# Patient Record
Sex: Female | Born: 1970 | Race: White | Hispanic: No | Smoking: Former smoker
Health system: Southern US, Community
[De-identification: ages and names within clinical notes are randomized; demographics above are authoritative.]

## PROBLEM LIST (undated history)

## (undated) DIAGNOSIS — T8859XA Other complications of anesthesia, initial encounter: Secondary | ICD-10-CM

## (undated) DIAGNOSIS — IMO0002 Reserved for concepts with insufficient information to code with codable children: Secondary | ICD-10-CM

## (undated) DIAGNOSIS — D649 Anemia, unspecified: Secondary | ICD-10-CM

## (undated) DIAGNOSIS — T4145XA Adverse effect of unspecified anesthetic, initial encounter: Secondary | ICD-10-CM

## (undated) HISTORY — PX: OTHER SURGICAL HISTORY: SHX169

---

## 2004-10-30 ENCOUNTER — Inpatient Hospital Stay (HOSPITAL_COMMUNITY): Admission: EM | Admit: 2004-10-30 | Discharge: 2004-11-07 | Payer: Self-pay | Admitting: Emergency Medicine

## 2004-11-15 ENCOUNTER — Encounter: Admission: RE | Admit: 2004-11-15 | Discharge: 2004-11-15 | Payer: Self-pay | Admitting: Cardiothoracic Surgery

## 2010-01-23 ENCOUNTER — Emergency Department (HOSPITAL_COMMUNITY): Admission: EM | Admit: 2010-01-23 | Discharge: 2010-01-24 | Payer: Self-pay | Admitting: Emergency Medicine

## 2010-01-29 ENCOUNTER — Emergency Department (HOSPITAL_COMMUNITY): Admission: EM | Admit: 2010-01-29 | Discharge: 2010-01-29 | Payer: Self-pay | Admitting: Emergency Medicine

## 2010-10-14 ENCOUNTER — Encounter: Payer: Self-pay | Admitting: Cardiothoracic Surgery

## 2010-12-11 LAB — CBC
Hemoglobin: 8.4 g/dL — ABNORMAL LOW (ref 12.0–15.0)
RBC: 4.52 MIL/uL (ref 3.87–5.11)
RDW: 17.9 % — ABNORMAL HIGH (ref 11.5–15.5)

## 2010-12-11 LAB — URINALYSIS, ROUTINE W REFLEX MICROSCOPIC
Bilirubin Urine: NEGATIVE
Ketones, ur: NEGATIVE mg/dL
Protein, ur: 30 mg/dL — AB
Urobilinogen, UA: 0.2 mg/dL (ref 0.0–1.0)

## 2010-12-11 LAB — COMPREHENSIVE METABOLIC PANEL
ALT: 17 U/L (ref 0–35)
Albumin: 4.3 g/dL (ref 3.5–5.2)
Alkaline Phosphatase: 58 U/L (ref 39–117)
Glucose, Bld: 101 mg/dL — ABNORMAL HIGH (ref 70–99)
Potassium: 3.2 mEq/L — ABNORMAL LOW (ref 3.5–5.1)
Sodium: 137 mEq/L (ref 135–145)
Total Protein: 7.4 g/dL (ref 6.0–8.3)

## 2010-12-11 LAB — WET PREP, GENITAL: Yeast Wet Prep HPF POC: NONE SEEN

## 2010-12-11 LAB — URINE MICROSCOPIC-ADD ON

## 2010-12-11 LAB — GC/CHLAMYDIA PROBE AMP, GENITAL
Chlamydia, DNA Probe: NEGATIVE
GC Probe Amp, Genital: NEGATIVE

## 2010-12-11 LAB — POCT PREGNANCY, URINE: Preg Test, Ur: NEGATIVE

## 2010-12-11 LAB — DIFFERENTIAL
Basophils Absolute: 0 10*3/uL (ref 0.0–0.1)
Basophils Relative: 0 % (ref 0–1)
Eosinophils Absolute: 0.1 10*3/uL (ref 0.0–0.7)
Monocytes Absolute: 0.6 10*3/uL (ref 0.1–1.0)
Neutrophils Relative %: 67 % (ref 43–77)

## 2011-02-08 NOTE — H&P (Signed)
Erin Ball, PERKOVICH NO.:  [id number]   MEDICAL RECORD NO.:  [id number]          PATIENT TYPE:  EMS   LOCATION:  MAJO                         FACILITY:  [facility]   PHYSICIAN:  Deirdre Peer. Polite, M.D. DATE OF BIRTH:  28-Mar-1971   DATE OF ADMISSION:  10/30/2004  DATE OF DISCHARGE:                                HISTORY & PHYSICAL   CHIEF COMPLAINT:  Cough, shortness of breath.   HISTORY OF PRESENT ILLNESS:  A 40 year old female with no significant past  medical history presents to the emergency department after being evaluated  at an urgent care facility at [facility]. The patient was seen there and  diagnosed with pneumonia. Sent to the emergency department for further  evaluation. At the time of my evaluation, the patient was in mild to  moderate distress secondary to excessive cough and shortness of breath. The  patient states that she has been feeling ill since December on and off.  States that she took ill again latter part of January to early February,  when she had paroxysms of cough, not really producing much sputum. Hot and  cold sweats, decreased appetite and vomiting over the last couple of days.  The patient states that she has been treating herself at home with Mucinex  and green tea. In the emergency department, as stated, the patient was  evaluated and found to be febrile at 102.2. Chest x-ray from outpatient  facility showed right lower lobe opacification. Admission is deemed  necessary for further evaluation and treatment of pneumonia and possible  empyema.   PAST MEDICAL HISTORY:  Negative.   MEDICATIONS:  Occasional Mucinex and green tea.   SOCIAL HISTORY:  Negative for tobacco, alcohol, or drugs.   PAST SURGICAL HISTORY:  The patient had tubes in tympanic membrane as a  child. Otherwise, no surgeries.   ALLERGIES:  No known drug allergies.   FAMILY HISTORY:  Mother with hypertension. Father with high cholesterol.   REVIEW OF SYSTEMS:   As stated in the HPI. Otherwise negative.   PHYSICAL EXAMINATION:  VITAL SIGNS:  Temperature 102.2, blood pressure  112/66, pulse 89, respiratory rate 24. Saturation 88% on room air, 95% on  nasal cannula.  GENERAL:  The patient is ill in appearance.  HEENT:  Significant for dry cracked lips, dry oral mucosa. No nodes. No  jugular venous distention appreciated.  CHEST:  Decreased breath sounds right hemi-thorax half way from the mid lung  field to the base with dullness to percussion. Left lung field, few basilar  crackles.  CARDIOVASCULAR:  Tachy.  ABDOMEN:  Soft, nontender.  EXTREMITIES:  No edema.  NEUROLOGIC:  Essentially non-focal.   LABORATORY DATA:  Chest x-ray:  Opacification right lung field from mid lung  to the base.   CBC:  White count 17.9, hemoglobin 10, hematocrit 30, MCV 71.3, platelets  556,000. ABG:  pH of 7.4, pCO2 39, pO2 of 98 on 2 liters nasal cannula.  Sodium 134, potassium 3.4, chloride 101, BUN 14, creatinine 0.7.   ASSESSMENT:  1.  Right lower lobe pneumonia plus/minus effusion. Must rule out empyema.  2.  Fever to 102.2.  3.  Nausea and vomiting.  4.  Leukocytosis.  5.  Anemia with low MCV.   RECOMMENDATIONS:  The patient to be admitted to step-down unit. The patient  is to be started on intravenous antibiotics, oxygen and nebulizers. The  patient will be pan cultured. Will obtain a STAT CT to rule out empyema. A  surgical consultation will be obtained if empyema is suggested by CT. Will  make further recommendations after review of the above.      RDP/MEDQ  D:  10/30/2004  T:  10/30/2004  Job:  [id number]

## 2011-02-08 NOTE — Op Note (Signed)
NAME:  Erin Ball, Erin Ball                 ACCOUNT NO.:  0   MEDICAL RECORD NO.:  [id number]           PATIENT TYPE:   LOCATION:                               FACILITY:  [facility]   PHYSICIAN:  Sheliah Plane, MD    DATE OF BIRTH:  05/13/1971   DATE OF PROCEDURE:  10/01/2004  DATE OF DISCHARGE:                                 OPERATIVE REPORT   PREOPERATIVE DIAGNOSIS:  Right effusion, rule out empyema.   POSTOPERATIVE DIAGNOSIS:  Right effusion, rule out empyema.   PROCEDURE PERFORMED:  1.  Right video assisted thoracoscopy.  2.  Right mini thoracotomy.  3.  Drainage of empyema.  4.  Decortication.   SURGEON:  Sheliah Plane, M.D.   FIRST ASSISTANT:  Maxwell Marion, N.P.   BRIEF HISTORY:  The patient is a 40 year old female who presented with  increasing right chest discomfort and episodic fever.  A chest x-ray and CT  scan reveal evidence of probable right empyema.  Drainage and decortication  was recommended to the patient who agreed and signed informed consent.   DESCRIPTION OF PROCEDURE:  The patient underwent general endotracheal  anesthesia without incidence.  The skin over the right chest was prepped  with Betadine and draped in the usual sterile manner.  The left lung only  was ventilated through a double lumen endotracheal tube.  Using a small  incision, a VATS trocar was introduced into the chest.  There was dense  adhesions and thickening of the pleura.  A small thoracotomy incision was  made and through this drainage of a large amount of purulent material was  carried out.  Areas of thickened parietal and visceral pleura were stripped  away.  The videoscope was used to assist in performing this and to ensure  all areas of purulent material were removed.  Two chest tubes were left in  place.  The incision was closed with a single pericostal stitch, interrupted  0 Vicryl in the muscle layers and 3-0 subcuticular stitch in the skin edges.  Dry dressings were applied.   The patient tolerated the procedure without  obvious complication and was extubated in the operating room and transferred  to the recovery room for further postoperative care.  Blood loss  approximately 150 mL.       EG/MEDQ  D:  02/27/2005  T:  02/27/2005  Job:  [id number]

## 2011-02-08 NOTE — Discharge Summary (Signed)
Erin Ball, WILLETTS NO.:  [id number]   MEDICAL RECORD NO.:  [id number]          PATIENT TYPE:  INP   LOCATION:  3308                         FACILITY:  [facility]   PHYSICIAN:  Sheliah Plane, MD    DATE OF BIRTH:  Nov 24, 1970   DATE OF ADMISSION:  10/30/2004  DATE OF DISCHARGE:                                 DISCHARGE SUMMARY   ADMISSION DIAGNOSIS:  Cough with shortness of breath.   PAST MEDICAL HISTORY AND DISCHARGE DIAGNOSIS:  Pneumonia with empyema  formation, status post right video assisted thoracoscopic surgery, mini  thoracotomy, drainage of empyema and decortication.   ALLERGIES:  No known drug allergies.   BRIEF HISTORY:  Patient is a 40 year old Caucasian female with no  significant past medical history who presented to [facility]  where she was diagnosed with pneumonia.  She was then sent to the emergency  department for further evaluation.  She was evaluated by Dr. Nehemiah Settle in the  ED and at the time of admission, she was complaining of an excessive cough  and shortness of breath.  She stated that she had begun to feel ill with  intermittent symptoms since the beginning of December.  The only symptom she  initially had was a nonproductive cough.  A few days prior to admission, she  began to experience hot and cold sweats with decreased appetite and  vomiting.  She had treated herself with Mucinex and green tea.  In the ED  when she was evaluated, the patient had a fever of 102.2 and a chest x-ray  revealed a right lower lobe opacification.  Patient was then admitted for  treatment of pneumonia and possible treatment of empyema.   HOSPITAL COURSE:  Patient was admitted via the emergency room on October 30, 2004, as previously stated.  Patient was started on IV antibiotics and a  stat chest CT was obtained to evaluate for empyema.  The CT revealed a large  complex loculated effusion of the right side consistent with empyema.  Secondary  to these results, Dr. Tyrone Sage of [facility] service was consulted  regarding treatment.  Dr. Tyrone Sage evaluated the patient on October 31, 2004, and it was his opinion that the patient should undergo a right video  assisted thoracoscopic surgery and drainage of empyema.   The patient was subsequently taken to the OR on October 31, 2004, for a  right video assisted thoracoscopic surgery, mini thoracotomy, drainage of  empyema and decortication.  The patient tolerated the procedure well and was  hemodynamically stable immediately postoperatively.  The patient was  transferred from the OR to the post anesthesia care unit in stable  condition.  The patient was extubated without complications and woke up from  anesthesia neurologically intact.   The remainder of the patient's postoperative course has progressed as  expected.  She has been maintained on IV Zosyn throughout the postoperative  course and her chest tubes were cared for in a routine manner.   On postoperative day #5, the patient was afebrile with stable vital signs  and the drainage of the chest tubes had  decreased significantly.  There had  been no air leaks present in the chest tubes since surgery.  The anterior  chest tube was discontinued on postoperative day #5 without complications.  The remaining chest tube was then discontinued on postoperative day #6 at  which time the patient was again afebrile with stable vital signs.  Her  chest x-ray was stable with no pneumothorax and showed an improving level of  the effusion.   PHYSICAL EXAMINATION:  CARDIAC:  Regular rate and rhythm.  LUNGS:  Crackles from the right mid lung down to the base and the chest tube  dressing was clean, dry and intact.   Patient is in stable condition at this time and pending morning round  reevaluation with chest x-ray, she should be stable for discharge in the  next one to two days.   LABORATORY DATA:  CBC on November 04, 2004, white count 9.1,  hemoglobin 8.9,  hematocrit 26.6, platelets 573.  BMP on November 04, 2004, sodium 134,  potassium 3.9, BUN less than 1, creatinine 0.6.  All cultures obtained in  the OR have returned negative.   CONDITION ON DISCHARGE:  Improved.   INSTRUCTIONS:  1.  Medication:      1.  Folic acid 1 mg p.o. daily.      2.  Iron supplement 325 mg daily.      3.  Tylox one to two p.o. q.4-6h. p.r.n. pain.      4.  Antibiotic choice will be made by Dr. Tyrone Sage on the day of          discharge.  As previously stated she has been maintained on IV Zosyn          throughout the hospital course.  2.  Activity:  The patient will be instructed to continue daily breathing      and walking exercises.  3.  Diet:  No restrictions.  4.  Wound care:  Patient should clean sites daily with soap and water.  If      the incisions become red, swollen or drain of if she experiences a fever      of 101 degrees F, the patient should contact the [facility] office.  5.  Follow-up appointment:      1.  [facility] November 15, 2004, at 11:20 a.m.      2.  Sheliah Plane, M.D., November 15, 2004, at 12:20 p.m.      AY/MEDQ  D:  11/06/2004  T:  11/06/2004  Job:  [id number]

## 2012-10-22 ENCOUNTER — Encounter (HOSPITAL_COMMUNITY): Payer: Self-pay | Admitting: *Deleted

## 2012-10-22 ENCOUNTER — Inpatient Hospital Stay (HOSPITAL_COMMUNITY): Payer: Self-pay

## 2012-10-22 ENCOUNTER — Observation Stay (HOSPITAL_COMMUNITY)
Admission: AD | Admit: 2012-10-22 | Discharge: 2012-10-23 | Disposition: A | Discharge status: Home / Self Care | Payer: MEDICAID | Source: Ambulatory Visit | Attending: Obstetrics & Gynecology | Admitting: Obstetrics & Gynecology

## 2012-10-22 DIAGNOSIS — D500 Iron deficiency anemia secondary to blood loss (chronic): Principal | ICD-10-CM | POA: Insufficient documentation

## 2012-10-22 DIAGNOSIS — N920 Excessive and frequent menstruation with regular cycle: Secondary | ICD-10-CM | POA: Diagnosis present

## 2012-10-22 HISTORY — DX: Other complications of anesthesia, initial encounter: T88.59XA

## 2012-10-22 HISTORY — DX: Anemia, unspecified: D64.9

## 2012-10-22 HISTORY — DX: Adverse effect of unspecified anesthetic, initial encounter: T41.45XA

## 2012-10-22 LAB — WET PREP, GENITAL
Clue Cells Wet Prep HPF POC: NONE SEEN
Trich, Wet Prep: NONE SEEN

## 2012-10-22 LAB — CBC
HCT: 20.2 % — ABNORMAL LOW (ref 36.0–46.0)
Hemoglobin: 5.9 g/dL — CL (ref 12.0–15.0)
MCH: 20.1 pg — ABNORMAL LOW (ref 26.0–34.0)
RBC: 2.94 MIL/uL — ABNORMAL LOW (ref 3.87–5.11)

## 2012-10-22 LAB — ABO/RH: ABO/RH(D): O POS

## 2012-10-22 MED ORDER — FUROSEMIDE 10 MG/ML IJ SOLN
20.0000 mg | Freq: Once | INTRAMUSCULAR | Status: AC
  Administered 2012-10-23: 20 mg via INTRAVENOUS
  Filled 2012-10-22: qty 2

## 2012-10-22 MED ORDER — ACETAMINOPHEN 325 MG PO TABS
650.0000 mg | ORAL_TABLET | Freq: Once | ORAL | Status: AC
  Administered 2012-10-22: 650 mg via ORAL
  Filled 2012-10-22: qty 2

## 2012-10-22 MED ORDER — MEGESTROL ACETATE 40 MG PO TABS
40.0000 mg | ORAL_TABLET | Freq: Every day | ORAL | Status: DC
  Administered 2012-10-22 – 2012-10-23 (×2): 40 mg via ORAL
  Filled 2012-10-22 (×3): qty 1

## 2012-10-22 MED ORDER — DIPHENHYDRAMINE HCL 50 MG/ML IJ SOLN
25.0000 mg | Freq: Once | INTRAMUSCULAR | Status: AC
  Administered 2012-10-22: 25 mg via INTRAVENOUS
  Filled 2012-10-22: qty 1

## 2012-10-22 MED ORDER — PRENATAL MULTIVITAMIN CH
1.0000 | ORAL_TABLET | Freq: Every day | ORAL | Status: DC
  Administered 2012-10-23: 1 via ORAL
  Filled 2012-10-22: qty 1

## 2012-10-22 MED ORDER — SODIUM CHLORIDE 0.9 % IV SOLN
INTRAVENOUS | Status: DC
  Administered 2012-10-22: 22:00:00 via INTRAVENOUS

## 2012-10-22 NOTE — MAU Note (Signed)
Pt states she started having bleeding 09/14/2012. Pt states she had some vaginal irritation a couple of days before 09/12/2012.

## 2012-10-22 NOTE — MAU Note (Signed)
Call from Lab on critical lab value.  Hgb 5.9.  Erin Ball CNM notified.

## 2012-10-22 NOTE — H&P (Signed)
Erin Ball is an 42 y.o. female presenting to MAU with vaginal bleeding daily x1.5 months.  She reports some days are heavy, some are light, but bleeding has persisted ~45 days.  Prior to this, her periods were regular but heavy.  She reports dizziness when standing up, and increasing fatigue every day. She denies vaginal itching/burning, urinary symptoms, h/a, n/v, or fever/chills.    Pertinent Gynecological History: Menses: flow is moderate, regular every month without intermenstrual spotting and usually lasting less than 6 days Bleeding: dysfunctional uterine bleeding Contraception: none DES exposure: denies Blood transfusions: none Sexually transmitted diseases: no past history Previous GYN Procedures: none per pt  Last mammogram: unknown Last pap: "A few years ago" Pt has no insurance.  OB History: G3, P3  Menstrual History:  Patient's last menstrual period was 09/14/2012.    Past Medical History  Diagnosis Date  . Complication of anesthesia   . Anemia     Past Surgical History  Procedure Date  . No past surgeries     Family History  Problem Relation Age of Onset  . Diabetes Mother   . Heart disease Mother   . Arthritis Mother   . Hypertension Mother   . Cancer Father   . Hyperlipidemia Father     Social History:  does not have a smoking history on file. She does not have any smokeless tobacco history on file. Her alcohol and drug histories not on file.  Allergies:  Allergies  Allergen Reactions  . Penicillins     Was given penicillin  After surgery, and broke out in a hives. She's not sure if it was from the penicillin.    Prescriptions prior to admission  Medication Sig Dispense Refill  . Multiple Vitamin (MULTIVITAMIN) tablet Take 1 tablet by mouth daily.        Review of Systems  Constitutional: Positive for malaise/fatigue. Negative for fever and chills.  Eyes: Negative for blurred vision.  Respiratory: Negative for cough and shortness of  breath.   Cardiovascular: Negative for chest pain.  Gastrointestinal: Negative for heartburn, nausea and vomiting.  Genitourinary: Negative for dysuria, urgency and frequency.  Musculoskeletal: Negative.   Neurological: Positive for dizziness. Negative for headaches.  Psychiatric/Behavioral: Negative for depression.    Blood pressure 127/81, pulse 88, temperature 98.3 F (36.8 C), temperature source Oral, resp. rate 18, last menstrual period 09/14/2012. Physical Exam  Nursing note and vitals reviewed. Constitutional: She is oriented to person, place, and time. She appears well-developed and well-nourished.  Neck: Normal range of motion.  Cardiovascular: Normal rate and regular rhythm.   Respiratory: Effort normal and breath sounds normal.  GI: Soft. Bowel sounds are normal.  Genitourinary:       Pelvic exam: Cervix pink, visually closed, without lesion, moderate amount dark red vaginal bleeding without clots, vaginal walls and external genitalia normal Bimanual exam: Cervix 0/long/high, firm, anterior, neg CMT, uterus nontender, nonenlarged, adnexa without tenderness, enlargement, or mass  Musculoskeletal: Normal range of motion.  Neurological: She is alert and oriented to person, place, and time. She has normal reflexes.  Skin: Skin is warm and dry.  Psychiatric: She has a normal mood and affect. Her behavior is normal. Judgment and thought content normal.    Results for orders placed during the hospital encounter of 10/22/12 (from the past 24 hour(s))  POCT PREGNANCY, URINE     Status: Normal   Collection Time   10/22/12  6:24 PM      Component Value Range  Preg Test, Ur NEGATIVE  NEGATIVE  WET PREP, GENITAL     Status: Abnormal   Collection Time   10/22/12  6:36 PM      Component Value Range   Yeast Wet Prep HPF POC NONE SEEN  NONE SEEN   Trich, Wet Prep NONE SEEN  NONE SEEN   Clue Cells Wet Prep HPF POC NONE SEEN  NONE SEEN   WBC, Wet Prep HPF POC FEW (*) NONE SEEN  CBC      Status: Abnormal   Collection Time   10/22/12  6:55 PM      Component Value Range   WBC 4.8  4.0 - 10.5 K/uL   RBC 2.94 (*) 3.87 - 5.11 MIL/uL   Hemoglobin 5.9 (*) 12.0 - 15.0 g/dL   HCT 16.1 (*) 09.6 - 04.5 %   MCV 68.7 (*) 78.0 - 100.0 fL   MCH 20.1 (*) 26.0 - 34.0 pg   MCHC 29.2 (*) 30.0 - 36.0 g/dL   RDW 40.9  81.1 - 91.4 %   Platelets 295  150 - 400 K/uL    US Transvaginal Non-ob  10/22/2012  *RADIOLOGY REPORT*  Clinical Data: 42 year old female with pelvic pain and vaginal bleeding.  TRANSABDOMINAL AND TRANSVAGINAL ULTRASOUND OF PELVIS Technique:  Both transabdominal and transvaginal ultrasound examinations of the pelvis were performed. Transabdominal technique was performed for global imaging of the pelvis including uterus, ovaries, adnexal regions, and pelvic cul-de-sac.  It was necessary to proceed with endovaginal exam following the transabdominal exam to visualize the ovaries and endometrium.  Comparison:  None  Findings:  Uterus: The uterus is anteverted measuring 10.8 x 5.5 x 9.3 cm.  No myometrial abnormalities are identified.  Endometrium: The endometrium is heterogeneous and thickened measuring 35 mm in greatest diameter.  Right ovary:  A 3.7 x 2.2 x 3.4 cm simple cyst is noted.  The right ovary is otherwise unremarkable measuring 4.2 x 2.9 x 4 cm.  Left ovary: The left ovary is unremarkable measuring 2.6 x 1.5 x 1.8 cm.  Other findings: A trace amount of free fluid is noted.  There is no evidence of adnexal mass.  IMPRESSION: Thickened heterogeneous endometrium measuring 35 mm in greatest diameter. Further evaluation with sonohysterogram or endometrial biopsy is recommended.  3.7 x 2.2 x 3.4 cm simple right ovarian cyst.   Original Report Authenticated By: Harmon Pier, M.D.    US Pelvis Complete  10/22/2012  *RADIOLOGY REPORT*  Clinical Data: 42 year old female with pelvic pain and vaginal bleeding.  TRANSABDOMINAL AND TRANSVAGINAL ULTRASOUND OF PELVIS Technique:  Both  transabdominal and transvaginal ultrasound examinations of the pelvis were performed. Transabdominal technique was performed for global imaging of the pelvis including uterus, ovaries, adnexal regions, and pelvic cul-de-sac.  It was necessary to proceed with endovaginal exam following the transabdominal exam to visualize the ovaries and endometrium.  Comparison:  None  Findings:  Uterus: The uterus is anteverted measuring 10.8 x 5.5 x 9.3 cm.  No myometrial abnormalities are identified.  Endometrium: The endometrium is heterogeneous and thickened measuring 35 mm in greatest diameter.  Right ovary:  A 3.7 x 2.2 x 3.4 cm simple cyst is noted.  The right ovary is otherwise unremarkable measuring 4.2 x 2.9 x 4 cm.  Left ovary: The left ovary is unremarkable measuring 2.6 x 1.5 x 1.8 cm.  Other findings: A trace amount of free fluid is noted.  There is no evidence of adnexal mass.  IMPRESSION: Thickened heterogeneous endometrium measuring 35 mm in  greatest diameter. Further evaluation with sonohysterogram or endometrial biopsy is recommended.  3.7 x 2.2 x 3.4 cm simple right ovarian cyst.   Original Report Authenticated By: Harmon Pier, M.D.     Assessment/Plan: Admit for observation to [facility] 3 units PRBCs Premedicate with Benadryl and Tylenol Postmedicate with Lasix CBC after blood received Megace 40 mg PO daily to start today  LEFTWICH-KIRBY, Rickard Kennerly 10/22/2012, 9:03 PM

## 2012-10-22 NOTE — MAU Note (Signed)
Pt states that her mom lies to get whatever she wants and her mom blames her for everything.her mom thinks pt is trying to make her a living hell.

## 2012-10-22 NOTE — MAU Provider Note (Signed)
Chief Complaint: Menorrhagia   First Provider Initiated Contact with Patient 10/22/12 1811     SUBJECTIVE HPI: Erin Ball is a 42 y.o. [id number] who presents to maternity admissions reporting vaginal bleeding x1.13months with abdominal cramping.  She reports bleeding every day, some days heavier than others and with clots.  She denies soaking a pad/hour or greater at any time.  She has no insurance and no regular Gyn care.  She was crying on admission to MAU because of a disagreement with her mother, who is present in the lobby, but reports her pain is not severe today.  Her crying resolves and she asks for her mother's presence during the pelvic exam.  She denies vaginal itching/burning, urinary symptoms, h/a, dizziness, n/v, or fever/chills.     Past Medical History  Diagnosis Date  . Complication of anesthesia   . Anemia    Past Surgical History  Procedure Date  . No past surgeries    History   Social History  . Marital Status: Legally Separated    Spouse Name: N/A    Number of Children: N/A  . Years of Education: N/A   Occupational History  . Not on file.   Social History Main Topics  . Smoking status: Not on file  . Smokeless tobacco: Not on file  . Alcohol Use: Not on file  . Drug Use: Not on file  . Sexually Active: Not on file   Other Topics Concern  . Not on file   Social History Narrative  . No narrative on file   No current facility-administered medications on file prior to encounter.   No current outpatient prescriptions on file prior to encounter.   Allergies  Allergen Reactions  . Penicillins     Was given penicillin  After surgery, and broke out in a hives. She's not sure if it was from the penicillin.    ROS: Pertinent items in HPI  OBJECTIVE Blood pressure 127/81, pulse 88, temperature 98.3 F (36.8 C), temperature source Oral, resp. rate 18, last menstrual period 09/14/2012. GENERAL: Well-developed, well-nourished female in no acute  distress.  HEENT: Normocephalic HEART: normal rate RESP: normal effort ABDOMEN: Soft, non-tender EXTREMITIES: Nontender, no edema NEURO: Alert and oriented Pelvic exam: Cervix pink, visually closed, without lesion, moderate amount dark red blood without clots, vaginal walls and external genitalia normal Bimanual exam: Cervix 0/long/high, firm, anterior, neg CMT, uterus nontender, nonenlarged, adnexa without tenderness, enlargement, or mass  LAB RESULTS Results for orders placed during the hospital encounter of 10/22/12 (from the past 48 hour(s))  POCT PREGNANCY, URINE     Status: Normal   Collection Time   10/22/12  6:24 PM      Component Value Range Comment   Preg Test, Ur NEGATIVE  NEGATIVE   GC/CHLAMYDIA PROBE AMP     Status: Normal   Collection Time   10/22/12  6:35 PM      Component Value Range Comment   CT Probe RNA NEGATIVE  NEGATIVE    GC Probe RNA NEGATIVE  NEGATIVE   WET PREP, GENITAL     Status: Abnormal   Collection Time   10/22/12  6:36 PM      Component Value Range Comment   Yeast Wet Prep HPF POC NONE SEEN  NONE SEEN    Trich, Wet Prep NONE SEEN  NONE SEEN    Clue Cells Wet Prep HPF POC NONE SEEN  NONE SEEN    WBC, Wet Prep HPF POC FEW (*) NONE  SEEN FEW BACTERIA SEEN  CBC     Status: Abnormal   Collection Time   10/22/12  6:55 PM      Component Value Range Comment   WBC 4.8  4.0 - 10.5 K/uL    RBC 2.94 (*) 3.87 - 5.11 MIL/uL    Hemoglobin 5.9 (*) 12.0 - 15.0 g/dL    HCT 40.9 (*) 81.1 - 46.0 %    MCV 68.7 (*) 78.0 - 100.0 fL    MCH 20.1 (*) 26.0 - 34.0 pg    MCHC 29.2 (*) 30.0 - 36.0 g/dL    RDW 91.4  78.2 - 95.6 %    Platelets 295  150 - 400 K/uL   TYPE AND SCREEN     Status: Normal (Preliminary result)   Collection Time   10/22/12  6:55 PM      Component Value Range Comment   ABO/RH(D) O POS      Antibody Screen NEG      Sample Expiration 10/25/2012      Unit Number [id number]      Blood Component Type RBC LR PHER1      Unit division 00      Status  of Unit ISSUED      Transfusion Status OK TO TRANSFUSE      Crossmatch Result Compatible      Unit Number [id number]      Blood Component Type RED CELLS,LR      Unit division 00      Status of Unit ISSUED      Transfusion Status OK TO TRANSFUSE      Crossmatch Result Compatible      Unit Number [id number]      Blood Component Type RED CELLS,LR      Unit division 00      Status of Unit ISSUED      Transfusion Status OK TO TRANSFUSE      Crossmatch Result Compatible     ABO/RH     Status: Normal   Collection Time   10/22/12  6:55 PM      Component Value Range Comment   ABO/RH(D) O POS     PREPARE RBC (CROSSMATCH)     Status: Normal   Collection Time   10/22/12 10:00 PM      Component Value Range Comment   Order Confirmation ORDER PROCESSED BY BLOOD BANK     CBC     Status: Abnormal   Collection Time   10/23/12  9:20 AM      Component Value Range Comment   WBC 5.9  4.0 - 10.5 K/uL    RBC 4.24  3.87 - 5.11 MIL/uL    Hemoglobin 10.2 (*) 12.0 - 15.0 g/dL    HCT 74.2 (*) 59.5 - 46.0 %    MCV 76.2 (*) 78.0 - 100.0 fL    MCH 24.1 (*) 26.0 - 34.0 pg    MCHC 31.6  30.0 - 36.0 g/dL    RDW 63.8 (*) 75.6 - 15.5 %    Platelets 291  150 - 400 K/uL     IMAGING US Transvaginal Non-ob  10/22/2012  *RADIOLOGY REPORT*  Clinical Data: 42 year old female with pelvic pain and vaginal bleeding.  TRANSABDOMINAL AND TRANSVAGINAL ULTRASOUND OF PELVIS Technique:  Both transabdominal and transvaginal ultrasound examinations of the pelvis were performed. Transabdominal technique was performed for global imaging of the pelvis including uterus, ovaries, adnexal regions, and pelvic cul-de-sac.  It was necessary to proceed with endovaginal exam following  the transabdominal exam to visualize the ovaries and endometrium.  Comparison:  None  Findings:  Uterus: The uterus is anteverted measuring 10.8 x 5.5 x 9.3 cm.  No myometrial abnormalities are identified.  Endometrium: The endometrium is heterogeneous and  thickened measuring 35 mm in greatest diameter.  Right ovary:  A 3.7 x 2.2 x 3.4 cm simple cyst is noted.  The right ovary is otherwise unremarkable measuring 4.2 x 2.9 x 4 cm.  Left ovary: The left ovary is unremarkable measuring 2.6 x 1.5 x 1.8 cm.  Other findings: A trace amount of free fluid is noted.  There is no evidence of adnexal mass.  IMPRESSION: Thickened heterogeneous endometrium measuring 35 mm in greatest diameter. Further evaluation with sonohysterogram or endometrial biopsy is recommended.  3.7 x 2.2 x 3.4 cm simple right ovarian cyst.   Original Report Authenticated By: Harmon Pier, M.D.    US Pelvis Complete  10/22/2012  *RADIOLOGY REPORT*  Clinical Data: 42 year old female with pelvic pain and vaginal bleeding.  TRANSABDOMINAL AND TRANSVAGINAL ULTRASOUND OF PELVIS Technique:  Both transabdominal and transvaginal ultrasound examinations of the pelvis were performed. Transabdominal technique was performed for global imaging of the pelvis including uterus, ovaries, adnexal regions, and pelvic cul-de-sac.  It was necessary to proceed with endovaginal exam following the transabdominal exam to visualize the ovaries and endometrium.  Comparison:  None  Findings:  Uterus: The uterus is anteverted measuring 10.8 x 5.5 x 9.3 cm.  No myometrial abnormalities are identified.  Endometrium: The endometrium is heterogeneous and thickened measuring 35 mm in greatest diameter.  Right ovary:  A 3.7 x 2.2 x 3.4 cm simple cyst is noted.  The right ovary is otherwise unremarkable measuring 4.2 x 2.9 x 4 cm.  Left ovary: The left ovary is unremarkable measuring 2.6 x 1.5 x 1.8 cm.  Other findings: A trace amount of free fluid is noted.  There is no evidence of adnexal mass.  IMPRESSION: Thickened heterogeneous endometrium measuring 35 mm in greatest diameter. Further evaluation with sonohysterogram or endometrial biopsy is recommended.  3.7 x 2.2 x 3.4 cm simple right ovarian cyst.   Original Report Authenticated By:  Harmon Pier, M.D.     ASSESSMENT 1. Iron deficiency anemia secondary to blood loss (chronic)   2. Menorrhagia     PLAN Called Dr Marice Potter to discuss assessment and findings Admit for observation to [facility]  3 units PRBCs  Premedicate with Benadryl and Tylenol  Postmedicate with Lasix  CBC after blood received  Megace 40 mg PO daily to start today       Medication List     As of 10/22/2012  6:21 PM    ASK your doctor about these medications         multivitamin tablet   Take 1 tablet by mouth daily.         Sharen Counter Certified Nurse-Midwife 10/22/2012  6:21 PM

## 2012-10-23 DIAGNOSIS — D500 Iron deficiency anemia secondary to blood loss (chronic): Secondary | ICD-10-CM

## 2012-10-23 DIAGNOSIS — N920 Excessive and frequent menstruation with regular cycle: Secondary | ICD-10-CM

## 2012-10-23 LAB — CBC
HCT: 32.3 % — ABNORMAL LOW (ref 36.0–46.0)
MCV: 76.2 fL — ABNORMAL LOW (ref 78.0–100.0)
Platelets: 291 10*3/uL (ref 150–400)
RBC: 4.24 MIL/uL (ref 3.87–5.11)
WBC: 5.9 10*3/uL (ref 4.0–10.5)

## 2012-10-23 LAB — GC/CHLAMYDIA PROBE AMP
CT Probe RNA: NEGATIVE
GC Probe RNA: NEGATIVE

## 2012-10-23 MED ORDER — MEGESTROL ACETATE 40 MG PO TABS: 40.0000 mg | ORAL_TABLET | Freq: Every day | ORAL | Status: DC

## 2012-10-23 MED ORDER — FERROUS SULFATE 325 (65 FE) MG PO TABS: 325.0000 mg | ORAL_TABLET | Freq: Two times a day (BID) | ORAL | Status: DC

## 2012-10-23 NOTE — Discharge Summary (Addendum)
Physician Discharge Summary  Patient ID: Erin Ball MRN: [id number] DOB/AGE: 28-Aug-1971 41 y.o.  Admit date: 10/22/2012 Discharge date: 10/23/2012  Admission Diagnoses: Symptomatic anemia secondary to menorrhagia  Discharge Diagnoses: same s/p 3 units blood transfusion Active Problems:  Menorrhagia   Discharged Condition: good  Hospital Course: Patient presented to MAU with complaints of generalized fatigue and vaginal bleeding for 45 days. Patient found to have a hg 5 and admitted for blood transfusion. Menorrhagia was controlled with Megace. Following her 3 units of pRBC, her Hg was 10. Patient was discharged in stable conditions with plans to follow-up at [facility] hospital clinic within the next 4 weeks. Patient advised to continue taking Megace and iron supplements. Discharge instructions provided.  Consults: None  Significant Diagnostic Studies: labs: pre transfusion Hg 5.9, post transfusion Hg 10.2 and  radiology: Ultrasound:  Findings:  Uterus: The uterus is anteverted measuring 10.8 x 5.5 x 9.3 cm. No  myometrial abnormalities are identified.  Endometrium: The endometrium is heterogeneous and thickened  measuring 35 mm in greatest diameter.  Right ovary: A 3.7 x 2.2 x 3.4 cm simple cyst is noted. The right  ovary is otherwise unremarkable measuring 4.2 x 2.9 x 4 cm.  Left ovary: The left ovary is unremarkable measuring 2.6 x 1.5 x  1.8 cm.  Other findings: A trace amount of free fluid is noted. There is no  evidence of adnexal mass   Treatments: blood transfusion  Discharge Exam: Blood pressure 117/73, pulse 76, temperature 98.5 F (36.9 C), temperature source Oral, resp. rate 18, height 5\' 2"  (1.575 m), weight 120 lb (54.432 kg), last menstrual period 09/14/2012, SpO2 100.00%. General appearance: alert, cooperative and no distress Resp: clear to auscultation bilaterally Cardio: S1, S2 normal GI: soft, non-tender; bowel sounds normal; no masses,  no  organomegaly Extremities: Homans sign is negative, no sign of DVT and no edema, redness or tenderness in the calves or thighs Peri-pad moderately saturated over the past 3 hours  Disposition:      Medication List     As of 10/23/2012  1:31 PM    TAKE these medications         ferrous sulfate 325 (65 FE) MG tablet   Take 1 tablet (325 mg total) by mouth 2 (two) times daily.      megestrol 40 MG tablet   Commonly known as: MEGACE   Take 1 tablet (40 mg total) by mouth daily.      multivitamin tablet   Take 1 tablet by mouth daily.           Follow-up Information    Follow up with [facility]. (You will be contacted with an appointment within 4 weeks)    Contact information:   [address] [place] [address] [phone]         Signed: Byrd Terrero 10/23/2012, 1:31 PM

## 2012-10-24 LAB — TYPE AND SCREEN: Unit division: 0

## 2012-10-27 ENCOUNTER — Encounter: Payer: Self-pay | Admitting: Obstetrics & Gynecology

## 2012-11-20 ENCOUNTER — Encounter: Payer: Self-pay | Admitting: Obstetrics & Gynecology

## 2012-11-25 ENCOUNTER — Encounter: Payer: Self-pay | Admitting: Obstetrics & Gynecology

## 2013-03-04 ENCOUNTER — Emergency Department (HOSPITAL_COMMUNITY)
Admission: EM | Admit: 2013-03-04 | Discharge: 2013-03-04 | Disposition: A | Discharge status: Home / Self Care | Payer: No Typology Code available for payment source | Attending: Emergency Medicine | Admitting: Emergency Medicine

## 2013-03-04 ENCOUNTER — Encounter (HOSPITAL_COMMUNITY): Payer: Self-pay | Admitting: Adult Health

## 2013-03-04 DIAGNOSIS — S161XXA Strain of muscle, fascia and tendon at neck level, initial encounter: Secondary | ICD-10-CM

## 2013-03-04 DIAGNOSIS — S139XXA Sprain of joints and ligaments of unspecified parts of neck, initial encounter: Secondary | ICD-10-CM | POA: Insufficient documentation

## 2013-03-04 DIAGNOSIS — Y92410 Unspecified street and highway as the place of occurrence of the external cause: Secondary | ICD-10-CM | POA: Insufficient documentation

## 2013-03-04 DIAGNOSIS — Y9389 Activity, other specified: Secondary | ICD-10-CM | POA: Insufficient documentation

## 2013-03-04 DIAGNOSIS — Z862 Personal history of diseases of the blood and blood-forming organs and certain disorders involving the immune mechanism: Secondary | ICD-10-CM | POA: Insufficient documentation

## 2013-03-04 DIAGNOSIS — IMO0002 Reserved for concepts with insufficient information to code with codable children: Secondary | ICD-10-CM | POA: Insufficient documentation

## 2013-03-04 DIAGNOSIS — Z880 Allergy status to penicillin: Secondary | ICD-10-CM | POA: Insufficient documentation

## 2013-03-04 MED ORDER — METHOCARBAMOL 750 MG PO TABS: 750.0000 mg | ORAL_TABLET | Freq: Four times a day (QID) | ORAL | Status: DC | PRN

## 2013-03-04 MED ORDER — HYDROCODONE-ACETAMINOPHEN 5-325 MG PO TABS: 1.0000 | ORAL_TABLET | Freq: Four times a day (QID) | ORAL | Status: DC | PRN
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
## 2013-03-04 NOTE — ED Notes (Signed)
The patient is AOx4 and comfortable with the discharge instructions. 
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
## 2013-03-05 NOTE — ED Provider Notes (Signed)
Medical screening examination/treatment/procedure(s) were performed by non-physician practitioner and as supervising physician I was immediately available for consultation/collaboration.   Carleene Cooper III, MD 03/05/13 [phone]

## 2013-03-11 ENCOUNTER — Inpatient Hospital Stay (HOSPITAL_COMMUNITY)
Admission: AD | Admit: 2013-03-11 | Discharge: 2013-03-11 | Disposition: A | Discharge status: Home / Self Care | Payer: Self-pay | Source: Ambulatory Visit | Attending: Obstetrics and Gynecology | Admitting: Obstetrics and Gynecology

## 2013-03-11 ENCOUNTER — Encounter (HOSPITAL_COMMUNITY): Payer: Self-pay | Admitting: *Deleted

## 2013-03-11 ENCOUNTER — Inpatient Hospital Stay (HOSPITAL_COMMUNITY): Payer: Self-pay

## 2013-03-11 DIAGNOSIS — R9389 Abnormal findings on diagnostic imaging of other specified body structures: Secondary | ICD-10-CM | POA: Insufficient documentation

## 2013-03-11 DIAGNOSIS — N920 Excessive and frequent menstruation with regular cycle: Secondary | ICD-10-CM | POA: Insufficient documentation

## 2013-03-11 HISTORY — DX: Reserved for concepts with insufficient information to code with codable children: IMO0002

## 2013-03-11 LAB — CBC
HCT: 26 % — ABNORMAL LOW (ref 36.0–46.0)
Hemoglobin: 7.7 g/dL — ABNORMAL LOW (ref 12.0–15.0)
MCHC: 29.6 g/dL — ABNORMAL LOW (ref 30.0–36.0)
RBC: 3.55 MIL/uL — ABNORMAL LOW (ref 3.87–5.11)

## 2013-03-11 LAB — WET PREP, GENITAL
Trich, Wet Prep: NONE SEEN
Yeast Wet Prep HPF POC: NONE SEEN

## 2013-03-11 MED ORDER — MEGESTROL ACETATE 40 MG PO TABS: 40.0000 mg | ORAL_TABLET | Freq: Every day | ORAL | Status: DC

## 2013-03-11 NOTE — MAU Note (Signed)
Pt states she started her period yesterday.  Today pt got up and had a large amount of vaginal bleeding with blood clots soaked through panties and pants.
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
## 2013-03-12 LAB — GC/CHLAMYDIA PROBE AMP
CT Probe RNA: NEGATIVE
GC Probe RNA: NEGATIVE

## 2013-04-07 NOTE — MAU Provider Note (Signed)
Attestation of Attending Supervision of Advanced Practitioner (CNM/NP): Evaluation and management procedures were performed by the Advanced Practitioner under my supervision and collaboration.  I have reviewed the Advanced Practitioner's note and chart, and I agree with the management and plan.  Haruki Arnold 04/07/2013 10:50 AM

## 2013-04-08 ENCOUNTER — Encounter: Payer: Self-pay | Admitting: Obstetrics & Gynecology

## 2013-09-23 NOTE — L&D Delivery Note (Signed)
Delivery Note At 4:01 PM a viable and healthy female was delivered via Vaginal, Spontaneous Delivery (Presentation: Right Occiput Anterior).  APGAR: 8, 9; weight 8 lb 3.6 oz (3731 g).   Placenta status: Manual removal by Dr. Erin FullingHarraway-Smith, sent to pathology.  Cord: 3 vessels with the following complications: None.    Anesthesia: Epidural  Episiotomy: N/A    Lacerations: none  Suture Repair: N/A Est. Blood Loss (mL): 800  Mother doing well postpartum, laughing Maternal grandmother at bedside  Mom to postpartum.  Baby to Couplet care / Skin to Skin.  Indie Boehne SHWON 08/04/2014, 4:52 PM

## 2014-07-25 ENCOUNTER — Encounter (HOSPITAL_COMMUNITY): Payer: Self-pay | Admitting: *Deleted

## 2014-08-01 ENCOUNTER — Inpatient Hospital Stay (HOSPITAL_COMMUNITY)
Admission: AD | Admit: 2014-08-01 | Discharge: 2014-08-02 | Disposition: A | Discharge status: Home / Self Care | Payer: Medicaid Other | Source: Ambulatory Visit | Attending: Family Medicine | Admitting: Family Medicine

## 2014-08-01 ENCOUNTER — Encounter (HOSPITAL_COMMUNITY): Payer: Self-pay

## 2014-08-01 ENCOUNTER — Inpatient Hospital Stay (HOSPITAL_COMMUNITY): Payer: Medicaid Other

## 2014-08-01 DIAGNOSIS — Z3490 Encounter for supervision of normal pregnancy, unspecified, unspecified trimester: Secondary | ICD-10-CM

## 2014-08-01 DIAGNOSIS — Z3A38 38 weeks gestation of pregnancy: Secondary | ICD-10-CM | POA: Insufficient documentation

## 2014-08-01 DIAGNOSIS — O471 False labor at or after 37 completed weeks of gestation: Secondary | ICD-10-CM | POA: Diagnosis present

## 2014-08-01 DIAGNOSIS — O0933 Supervision of pregnancy with insufficient antenatal care, third trimester: Secondary | ICD-10-CM | POA: Insufficient documentation

## 2014-08-01 DIAGNOSIS — Z363 Encounter for antenatal screening for malformations: Secondary | ICD-10-CM | POA: Insufficient documentation

## 2014-08-01 DIAGNOSIS — O09529 Supervision of elderly multigravida, unspecified trimester: Secondary | ICD-10-CM | POA: Insufficient documentation

## 2014-08-01 DIAGNOSIS — Z1389 Encounter for screening for other disorder: Secondary | ICD-10-CM | POA: Insufficient documentation

## 2014-08-01 LAB — CBC
HCT: 34 % — ABNORMAL LOW (ref 36.0–46.0)
HEMOGLOBIN: 10.6 g/dL — AB (ref 12.0–15.0)
MCH: 23.1 pg — AB (ref 26.0–34.0)
MCHC: 31.2 g/dL (ref 30.0–36.0)
MCV: 74.1 fL — ABNORMAL LOW (ref 78.0–100.0)
Platelets: 292 10*3/uL (ref 150–400)
RBC: 4.59 MIL/uL (ref 3.87–5.11)
RDW: 17.7 % — ABNORMAL HIGH (ref 11.5–15.5)
WBC: 12.7 10*3/uL — ABNORMAL HIGH (ref 4.0–10.5)

## 2014-08-01 LAB — URINALYSIS, ROUTINE W REFLEX MICROSCOPIC
Bilirubin Urine: NEGATIVE
GLUCOSE, UA: NEGATIVE mg/dL
Ketones, ur: NEGATIVE mg/dL
LEUKOCYTES UA: NEGATIVE
Nitrite: NEGATIVE
PH: 6 (ref 5.0–8.0)
Protein, ur: NEGATIVE mg/dL
Specific Gravity, Urine: 1.01 (ref 1.005–1.030)
Urobilinogen, UA: 0.2 mg/dL (ref 0.0–1.0)

## 2014-08-01 LAB — URINE MICROSCOPIC-ADD ON

## 2014-08-01 LAB — OB RESULTS CONSOLE GC/CHLAMYDIA
CHLAMYDIA, DNA PROBE: NEGATIVE
Gonorrhea: NEGATIVE

## 2014-08-01 LAB — OB RESULTS CONSOLE GBS: STREP GROUP B AG: NEGATIVE

## 2014-08-01 NOTE — MAU Note (Addendum)
Contractions started to be stronger at 3 pm.  No leaking.  No bleeding. Baby moving well.  NO PRENATAL CARE.  ?LMP in Feb 2015.
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
## 2014-08-02 ENCOUNTER — Inpatient Hospital Stay (HOSPITAL_COMMUNITY)
Admission: AD | Admit: 2014-08-02 | Discharge: 2014-08-02 | Disposition: A | Discharge status: Home / Self Care | Payer: Medicaid Other | Source: Ambulatory Visit | Attending: Family Medicine | Admitting: Family Medicine

## 2014-08-02 ENCOUNTER — Encounter (HOSPITAL_COMMUNITY): Payer: Self-pay

## 2014-08-02 DIAGNOSIS — Z3A38 38 weeks gestation of pregnancy: Secondary | ICD-10-CM | POA: Insufficient documentation

## 2014-08-02 DIAGNOSIS — O09529 Supervision of elderly multigravida, unspecified trimester: Secondary | ICD-10-CM | POA: Insufficient documentation

## 2014-08-02 DIAGNOSIS — Z1389 Encounter for screening for other disorder: Secondary | ICD-10-CM | POA: Insufficient documentation

## 2014-08-02 DIAGNOSIS — O0933 Supervision of pregnancy with insufficient antenatal care, third trimester: Secondary | ICD-10-CM | POA: Insufficient documentation

## 2014-08-02 LAB — RAPID URINE DRUG SCREEN, HOSP PERFORMED
Amphetamines: NOT DETECTED
BARBITURATES: NOT DETECTED
Benzodiazepines: NOT DETECTED
Cocaine: NOT DETECTED
Opiates: NOT DETECTED
Tetrahydrocannabinol: NOT DETECTED

## 2014-08-02 LAB — GC/CHLAMYDIA PROBE AMP
CT PROBE, AMP APTIMA: NEGATIVE
GC Probe RNA: NEGATIVE

## 2014-08-02 LAB — RPR

## 2014-08-02 LAB — HIV ANTIBODY (ROUTINE TESTING W REFLEX): HIV: NONREACTIVE

## 2014-08-02 LAB — VARICELLA ZOSTER ANTIBODY, IGG: Varicella IgG: 676.5 Index — ABNORMAL HIGH (ref <135.00)

## 2014-08-02 LAB — TYPE AND SCREEN
ABO/RH(D): O POS
Antibody Screen: NEGATIVE

## 2014-08-02 LAB — RUBELLA SCREEN: RUBELLA: 4.74 {index} — AB (ref <0.90)

## 2014-08-02 LAB — HEPATITIS B SURFACE ANTIGEN: Hepatitis B Surface Ag: NEGATIVE

## 2014-08-02 LAB — RAPID HIV SCREEN (WH-MAU): Rapid HIV Screen: NONREACTIVE

## 2014-08-02 MED ORDER — OXYCODONE-ACETAMINOPHEN 5-325 MG PO TABS: 1.0000 | ORAL_TABLET | Freq: Four times a day (QID) | ORAL | Status: DC | PRN

## 2014-08-02 NOTE — Discharge Instructions (Signed)
Braxton Hicks Contractions °Contractions of the uterus can occur throughout pregnancy. Contractions are not always a sign that you are in labor.  °WHAT ARE BRAXTON HICKS CONTRACTIONS?  °Contractions that occur before labor are called Braxton Hicks contractions, or false labor. Toward the end of pregnancy (32-34 weeks), these contractions can develop more often and may become more forceful. This is not true labor because these contractions do not result in opening (dilatation) and thinning of the cervix. They are sometimes difficult to tell apart from true labor because these contractions can be forceful and people have different pain tolerances. You should not feel embarrassed if you go to the hospital with false labor. Sometimes, the only way to tell if you are in true labor is for your health care provider to look for changes in the cervix. °If there are no prenatal problems or other health problems associated with the pregnancy, it is completely safe to be sent home with false labor and await the onset of true labor. °HOW CAN YOU TELL THE DIFFERENCE BETWEEN TRUE AND FALSE LABOR? °False Labor °· The contractions of false labor are usually shorter and not as hard as those of true labor.   °· The contractions are usually irregular.   °· The contractions are often felt in the front of the lower abdomen and in the groin.   °· The contractions may go away when you walk around or change positions while lying down.   °· The contractions get weaker and are shorter lasting as time goes on.   °· The contractions do not usually become progressively stronger, regular, and closer together as with true labor.   °True Labor °· Contractions in true labor last 30-70 seconds, become very regular, usually become more intense, and increase in frequency.   °· The contractions do not go away with walking.   °· The discomfort is usually felt in the top of the uterus and spreads to the lower abdomen and low back.   °· True labor can be  determined by your health care provider with an exam. This will show that the cervix is dilating and getting thinner.   °WHAT TO REMEMBER °· Keep up with your usual exercises and follow other instructions given by your health care provider.   °· Take medicines as directed by your health care provider.   °· Keep your regular prenatal appointments.   °· Eat and drink lightly if you think you are going into labor.   °· If Braxton Hicks contractions are making you uncomfortable:   °¨ Change your position from lying down or resting to walking, or from walking to resting.   °¨ Sit and rest in a tub of warm water.   °¨ Drink 2-3 glasses of water. Dehydration may cause these contractions.   °¨ Do slow and deep breathing several times an hour.   °WHEN SHOULD I SEEK IMMEDIATE MEDICAL CARE? °Seek immediate medical care if: °· Your contractions become stronger, more regular, and closer together.   °· You have fluid leaking or gushing from your vagina.   °· You have a fever.   °· You pass blood-tinged mucus.   °· You have vaginal bleeding.   °· You have continuous abdominal pain.   °· You have low back pain that you never had before.   °· You feel your baby's head pushing down and causing pelvic pressure.   °· Your baby is not moving as much as it used to.   °Document Released: 09/09/2005 Document Revised: 09/14/2013 Document Reviewed: 06/21/2013 °[employer]® Patient Information ©2015 [employer]. This information is not intended to replace advice given to you by your health care   provider. Make sure you discuss any questions you have with your health care provider. ° °

## 2014-08-02 NOTE — MAU Note (Addendum)
At 4 am had some bleeding, thick- looking dripped in toliet. Contractions stronger than earlier.  Baby moving well.  No leaking.  No PRENATAL CARE.

## 2014-08-03 LAB — CULTURE, BETA STREP (GROUP B ONLY)

## 2014-08-04 ENCOUNTER — Inpatient Hospital Stay (HOSPITAL_COMMUNITY)
Admission: AD | Admit: 2014-08-04 | Discharge: 2014-08-06 | DRG: 767 | Disposition: A | Discharge status: Home / Self Care | Payer: Medicaid Other | Source: Ambulatory Visit | Attending: Obstetrics & Gynecology | Admitting: Obstetrics & Gynecology

## 2014-08-04 ENCOUNTER — Encounter (HOSPITAL_COMMUNITY): Payer: Self-pay | Admitting: *Deleted

## 2014-08-04 ENCOUNTER — Inpatient Hospital Stay (HOSPITAL_COMMUNITY): Payer: Medicaid Other | Admitting: Anesthesiology

## 2014-08-04 DIAGNOSIS — O09523 Supervision of elderly multigravida, third trimester: Secondary | ICD-10-CM | POA: Diagnosis not present

## 2014-08-04 DIAGNOSIS — Z880 Allergy status to penicillin: Secondary | ICD-10-CM

## 2014-08-04 DIAGNOSIS — Z3A38 38 weeks gestation of pregnancy: Secondary | ICD-10-CM | POA: Diagnosis present

## 2014-08-04 DIAGNOSIS — O471 False labor at or after 37 completed weeks of gestation: Secondary | ICD-10-CM | POA: Diagnosis present

## 2014-08-04 DIAGNOSIS — IMO0001 Reserved for inherently not codable concepts without codable children: Secondary | ICD-10-CM

## 2014-08-04 DIAGNOSIS — Z87891 Personal history of nicotine dependence: Secondary | ICD-10-CM

## 2014-08-04 DIAGNOSIS — Z8249 Family history of ischemic heart disease and other diseases of the circulatory system: Secondary | ICD-10-CM

## 2014-08-04 DIAGNOSIS — Z833 Family history of diabetes mellitus: Secondary | ICD-10-CM

## 2014-08-04 DIAGNOSIS — O0933 Supervision of pregnancy with insufficient antenatal care, third trimester: Secondary | ICD-10-CM

## 2014-08-04 LAB — TYPE AND SCREEN
ABO/RH(D): O POS
Antibody Screen: NEGATIVE

## 2014-08-04 LAB — CBC
HCT: 33.1 % — ABNORMAL LOW (ref 36.0–46.0)
HCT: 31.2 % — ABNORMAL LOW (ref 36.0–46.0)
HEMOGLOBIN: 10.3 g/dL — AB (ref 12.0–15.0)
Hemoglobin: 9.7 g/dL — ABNORMAL LOW (ref 12.0–15.0)
MCH: 23.3 pg — ABNORMAL LOW (ref 26.0–34.0)
MCH: 23.2 pg — AB (ref 26.0–34.0)
MCHC: 31.1 g/dL (ref 30.0–36.0)
MCHC: 31.1 g/dL (ref 30.0–36.0)
MCV: 74.7 fL — ABNORMAL LOW (ref 78.0–100.0)
MCV: 74.6 fL — AB (ref 78.0–100.0)
PLATELETS: 258 10*3/uL (ref 150–400)
Platelets: 275 10*3/uL (ref 150–400)
RBC: 4.43 MIL/uL (ref 3.87–5.11)
RBC: 4.18 MIL/uL (ref 3.87–5.11)
RDW: 18 % — ABNORMAL HIGH (ref 11.5–15.5)
RDW: 17.8 % — ABNORMAL HIGH (ref 11.5–15.5)
WBC: 11.2 10*3/uL — AB (ref 4.0–10.5)
WBC: 18.7 10*3/uL — ABNORMAL HIGH (ref 4.0–10.5)

## 2014-08-04 LAB — POCT FERN TEST: POCT Fern Test: POSITIVE

## 2014-08-04 LAB — RPR

## 2014-08-04 MED ORDER — WITCH HAZEL-GLYCERIN EX PADS
1.0000 "application " | MEDICATED_PAD | CUTANEOUS | Status: DC | PRN
  Administered 2014-08-05: 1 via TOPICAL

## 2014-08-04 MED ORDER — OXYCODONE-ACETAMINOPHEN 5-325 MG PO TABS: 2.0000 | ORAL_TABLET | ORAL | Status: DC | PRN

## 2014-08-04 MED ORDER — LIDOCAINE HCL (PF) 1 % IJ SOLN
30.0000 mL | INTRAMUSCULAR | Status: DC | PRN
  Filled 2014-08-04: qty 30

## 2014-08-04 MED ORDER — BENZOCAINE-MENTHOL 20-0.5 % EX AERO: 1.0000 "application " | INHALATION_SPRAY | CUTANEOUS | Status: DC | PRN

## 2014-08-04 MED ORDER — ACETAMINOPHEN 325 MG PO TABS: 650.0000 mg | ORAL_TABLET | ORAL | Status: DC | PRN

## 2014-08-04 MED ORDER — EPHEDRINE 5 MG/ML INJ
10.0000 mg | INTRAVENOUS | Status: DC | PRN
  Filled 2014-08-04: qty 2

## 2014-08-04 MED ORDER — MISOPROSTOL 200 MCG PO TABS
800.0000 ug | ORAL_TABLET | Freq: Once | ORAL | Status: AC
  Administered 2014-08-04: 1000 ug via RECTAL

## 2014-08-04 MED ORDER — CLINDAMYCIN PHOSPHATE 900 MG/50ML IV SOLN: 900.0000 mg | Freq: Three times a day (TID) | INTRAVENOUS | Status: DC

## 2014-08-04 MED ORDER — SIMETHICONE 80 MG PO CHEW: 80.0000 mg | CHEWABLE_TABLET | ORAL | Status: DC | PRN

## 2014-08-04 MED ORDER — MISOPROSTOL 200 MCG PO TABS
ORAL_TABLET | ORAL | Status: AC
Start: 2014-08-04 — End: 2014-08-04
  Administered 2014-08-04: 1000 ug via RECTAL
  Filled 2014-08-04: qty 5

## 2014-08-04 MED ORDER — OXYCODONE-ACETAMINOPHEN 5-325 MG PO TABS: 1.0000 | ORAL_TABLET | ORAL | Status: DC | PRN

## 2014-08-04 MED ORDER — EPHEDRINE 5 MG/ML INJ
10.0000 mg | INTRAVENOUS | Status: DC | PRN
Start: 2014-08-04 — End: 2014-08-04
  Filled 2014-08-04: qty 2

## 2014-08-04 MED ORDER — ONDANSETRON HCL 4 MG PO TABS: 4.0000 mg | ORAL_TABLET | ORAL | Status: DC | PRN

## 2014-08-04 MED ORDER — CITRIC ACID-SODIUM CITRATE 334-500 MG/5ML PO SOLN: 30.0000 mL | ORAL | Status: DC | PRN

## 2014-08-04 MED ORDER — LANOLIN HYDROUS EX OINT: TOPICAL_OINTMENT | CUTANEOUS | Status: DC | PRN

## 2014-08-04 MED ORDER — LACTATED RINGERS IV SOLN: 500.0000 mL | Freq: Once | INTRAVENOUS | Status: DC

## 2014-08-04 MED ORDER — TETANUS-DIPHTH-ACELL PERTUSSIS 5-2.5-18.5 LF-MCG/0.5 IM SUSP
0.5000 mL | Freq: Once | INTRAMUSCULAR | Status: AC
  Administered 2014-08-05: 0.5 mL via INTRAMUSCULAR

## 2014-08-04 MED ORDER — FENTANYL 2.5 MCG/ML BUPIVACAINE 1/10 % EPIDURAL INFUSION (WH - ANES)
14.0000 mL/h | INTRAMUSCULAR | Status: DC | PRN
  Administered 2014-08-04: 14 mL/h via EPIDURAL
  Filled 2014-08-04: qty 125

## 2014-08-04 MED ORDER — OXYTOCIN 40 UNITS IN LACTATED RINGERS INFUSION - SIMPLE MED
62.5000 mL/h | INTRAVENOUS | Status: DC
  Filled 2014-08-04: qty 1000

## 2014-08-04 MED ORDER — OXYTOCIN BOLUS FROM INFUSION: 500.0000 mL | INTRAVENOUS | Status: DC

## 2014-08-04 MED ORDER — DIPHENHYDRAMINE HCL 50 MG/ML IJ SOLN: 12.5000 mg | INTRAMUSCULAR | Status: DC | PRN

## 2014-08-04 MED ORDER — OXYTOCIN 40 UNITS IN LACTATED RINGERS INFUSION - SIMPLE MED
1.0000 m[IU]/min | INTRAVENOUS | Status: DC
  Administered 2014-08-04: 2 m[IU]/min via INTRAVENOUS

## 2014-08-04 MED ORDER — DIBUCAINE 1 % RE OINT
1.0000 "application " | TOPICAL_OINTMENT | RECTAL | Status: DC | PRN
  Administered 2014-08-05: 1 via RECTAL
  Filled 2014-08-04: qty 28

## 2014-08-04 MED ORDER — ONDANSETRON HCL 4 MG/2ML IJ SOLN: 4.0000 mg | Freq: Four times a day (QID) | INTRAMUSCULAR | Status: DC | PRN

## 2014-08-04 MED ORDER — ZOLPIDEM TARTRATE 5 MG PO TABS: 5.0000 mg | ORAL_TABLET | Freq: Every evening | ORAL | Status: DC | PRN

## 2014-08-04 MED ORDER — GENTAMICIN SULFATE 40 MG/ML IJ SOLN
Freq: Three times a day (TID) | INTRAVENOUS | Status: AC
  Administered 2014-08-04 – 2014-08-05 (×2): via INTRAVENOUS
  Filled 2014-08-04 (×3): qty 3.5

## 2014-08-04 MED ORDER — LIDOCAINE HCL (PF) 1 % IJ SOLN
INTRAMUSCULAR | Status: DC | PRN
Start: 2014-08-04 — End: 2014-08-04
  Administered 2014-08-04 (×2): 5 mL

## 2014-08-04 MED ORDER — LACTATED RINGERS IV SOLN: 500.0000 mL | INTRAVENOUS | Status: DC | PRN

## 2014-08-04 MED ORDER — FLEET ENEMA 7-19 GM/118ML RE ENEM: 1.0000 | ENEMA | RECTAL | Status: DC | PRN

## 2014-08-04 MED ORDER — DIPHENHYDRAMINE HCL 25 MG PO CAPS: 25.0000 mg | ORAL_CAPSULE | Freq: Four times a day (QID) | ORAL | Status: DC | PRN

## 2014-08-04 MED ORDER — IBUPROFEN 600 MG PO TABS
600.0000 mg | ORAL_TABLET | Freq: Four times a day (QID) | ORAL | Status: DC
  Administered 2014-08-05 – 2014-08-06 (×6): 600 mg via ORAL
  Filled 2014-08-04 (×6): qty 1

## 2014-08-04 MED ORDER — PHENYLEPHRINE 40 MCG/ML (10ML) SYRINGE FOR IV PUSH (FOR BLOOD PRESSURE SUPPORT)
80.0000 ug | PREFILLED_SYRINGE | INTRAVENOUS | Status: DC | PRN
  Filled 2014-08-04: qty 2
  Filled 2014-08-04: qty 10

## 2014-08-04 MED ORDER — SENNOSIDES-DOCUSATE SODIUM 8.6-50 MG PO TABS
2.0000 | ORAL_TABLET | ORAL | Status: DC
  Administered 2014-08-05 – 2014-08-06 (×2): 2 via ORAL
  Filled 2014-08-04 (×2): qty 2

## 2014-08-04 MED ORDER — OXYCODONE-ACETAMINOPHEN 5-325 MG PO TABS
2.0000 | ORAL_TABLET | ORAL | Status: DC | PRN
Start: 2014-08-04 — End: 2014-08-04

## 2014-08-04 MED ORDER — ONDANSETRON HCL 4 MG/2ML IJ SOLN: 4.0000 mg | INTRAMUSCULAR | Status: DC | PRN

## 2014-08-04 MED ORDER — TERBUTALINE SULFATE 1 MG/ML IJ SOLN: 0.2500 mg | Freq: Once | INTRAMUSCULAR | Status: DC | PRN

## 2014-08-04 MED ORDER — PHENYLEPHRINE 40 MCG/ML (10ML) SYRINGE FOR IV PUSH (FOR BLOOD PRESSURE SUPPORT)
80.0000 ug | PREFILLED_SYRINGE | INTRAVENOUS | Status: DC | PRN
  Filled 2014-08-04: qty 2

## 2014-08-04 MED ORDER — FENTANYL CITRATE 0.05 MG/ML IJ SOLN
100.0000 ug | INTRAMUSCULAR | Status: DC | PRN
  Administered 2014-08-04 (×3): 100 ug via INTRAVENOUS
  Filled 2014-08-04 (×3): qty 2

## 2014-08-04 MED ORDER — PRENATAL MULTIVITAMIN CH
1.0000 | ORAL_TABLET | Freq: Every day | ORAL | Status: DC
  Administered 2014-08-05: 1 via ORAL
  Filled 2014-08-04: qty 1

## 2014-08-04 MED ORDER — LACTATED RINGERS IV SOLN
INTRAVENOUS | Status: DC
  Administered 2014-08-04: 05:00:00 via INTRAVENOUS
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
## 2014-08-04 NOTE — Progress Notes (Signed)
Patient ID: Erin Goldsllison C Tacey, female   DOB: [birth year], 43 y.o.   MRN: [id number] Erin Ball is a 43 y.o. G4P3003 at 3063w6d.  Subjective: Comfortable w/ epidural  Objective: BP 142/64 mmHg  Pulse 79  Temp(Src) 98.1 F (36.7 C) (Oral)  Resp 18  Ht 5\' 1"  (1.549 m)  Wt 81.194 kg (179 lb)  BMI 33.84 kg/m2  SpO2 98%   FHT:  FHR: 135 bpm, variability: mod,  accelerations:  15x15,  decelerations:  none UC:   Q 2-3 minutes, strong on pitocin Dilation: 8 Effacement (%): 80 Cervical Position: Anterior Station: -2 Presentation: Vertex Exam by:: hk  Labs: Results for orders placed or performed during the hospital encounter of 08/04/14 (from the past 24 hour(s))  Fern Test     Status: Abnormal   Collection Time: 08/04/14  4:03 AM  Result Value Ref Range   POCT Fern Test Positive = ruptured amniotic membanes   CBC     Status: Abnormal   Collection Time: 08/04/14  4:35 AM  Result Value Ref Range   WBC 11.2 (H) 4.0 - 10.5 K/uL   RBC 4.43 3.87 - 5.11 MIL/uL   Hemoglobin 10.3 (L) 12.0 - 15.0 g/dL   HCT 40.133.1 (L) 02.736.0 - 25.346.0 %   MCV 74.7 (L) 78.0 - 100.0 fL   MCH 23.3 (L) 26.0 - 34.0 pg   MCHC 31.1 30.0 - 36.0 g/dL   RDW 66.418.0 (H) 40.311.5 - 47.415.5 %   Platelets 275 150 - 400 K/uL  RPR     Status: None   Collection Time: 08/04/14  4:35 AM  Result Value Ref Range   RPR NON REAC NON REAC  Type and screen     Status: None   Collection Time: 08/04/14  4:35 AM  Result Value Ref Range   ABO/RH(D) O POS    Antibody Screen NEG    Sample Expiration 08/07/2014     Assessment / Plan: 8463w6d week IUP Labor: Transition Fetal Wellbeing:  Category I Pain Control:  Epidural Anticipated MOD:  NVVD   Dorathy KinsmanVirginia Anas Reister, CNM 08/04/2014 2:21 PM

## 2014-08-04 NOTE — Progress Notes (Signed)
Peds in Attendance Note:  Attended the vaginal delivery of this white female infant with meconium stained fluid. Infant presented with spontaneous cry.  Delee suctioned in RHW and obtained small amount of blood tinged mucous. Apgars 8 at 1 min (2 off color) and 9 at 1 min (1 off color).  Stable in room air in no acute distress. No gross abnormalities seen.   Infant remained with L&D nurse to be placed skin to skin with mom.    HOLT, HARRIETT T, RN, NNP-BC 

## 2014-08-04 NOTE — Plan of Care (Signed)
Problem: Consults Goal: Birthing Suites Patient Information Press F2 to bring up selections list Outcome: Completed/Met Date Met:  08/04/14  Pt 37-[redacted] weeks EGA

## 2014-08-04 NOTE — MAU Note (Signed)
Pt reports ROM at 0300, some contractions. Denies problems with preg.

## 2014-08-04 NOTE — Plan of Care (Signed)
Problem: Phase I Progression Outcomes Goal: Pain controlled with appropriate interventions Outcome: Completed/Met Date Met:  08/04/14 Goal: Foley catheter patent Outcome: Not Applicable Date Met:  51/83/43

## 2014-08-04 NOTE — Anesthesia Procedure Notes (Signed)
Epidural Patient location during procedure: OB Start time: 08/04/2014 12:07 PM  Staffing Anesthesiologist: Brayton CavesJACKSON, Marquiz Sotelo Performed by: anesthesiologist   Preanesthetic Checklist Completed: patient identified, site marked, surgical consent, pre-op evaluation, timeout performed, IV checked, risks and benefits discussed and monitors and equipment checked  Epidural Patient position: sitting Prep: site prepped and draped and DuraPrep Patient monitoring: continuous pulse ox and blood pressure Approach: midline Location: L3-L4 Injection technique: LOR air  Needle:  Needle type: Tuohy  Needle gauge: 17 G Needle length: 9 cm and 9 Needle insertion depth: 5 cm cm Catheter type: closed end flexible Catheter size: 19 Gauge Catheter at skin depth: 10 cm Test dose: negative  Assessment Events: blood not aspirated, injection not painful, no injection resistance, negative IV test and no paresthesia  Additional Notes Patient identified.  Risk benefits discussed including failed block, incomplete pain control, headache, nerve damage, paralysis, blood pressure changes, nausea, vomiting, reactions to medication both toxic or allergic, and postpartum back pain.  Patient expressed understanding and wished to proceed.  All questions were answered.  Sterile technique used throughout procedure and epidural site dressed with sterile barrier dressing. No paresthesia or other complications noted.The patient did not experience any signs of intravascular injection such as tinnitus or metallic taste in mouth nor signs of intrathecal spread such as rapid motor block. Please see nursing notes for vital signs.

## 2014-08-04 NOTE — Anesthesia Preprocedure Evaluation (Signed)

## 2014-08-04 NOTE — Progress Notes (Signed)
Erin Ball is a 43 y.o. [phone] at 4945w6d admitted for spontaneous onset to labor.  Subjective: Doing well, "ready to have a baby."  Objective: BP 113/77 mmHg  Pulse 98  Temp(Src) 98.3 F (36.8 C) (Oral)  Resp 18  Ht 5\' 1"  (1.549 m)  Wt 179 lb (81.194 kg)  BMI 33.84 kg/m2  SpO2 98%    FHT:  FHR: 135 bpm, variability: moderate,  accelerations:  Present,  decelerations:  Absent UC:   regular, every 3 minutes  SVE:   Dilation: 5.5 Effacement (%): 70 Station: -2 Exam by:: smith, cnm   Labs: Lab Results  Component Value Date   WBC 11.2* 08/04/2014   HGB 10.3* 08/04/2014   HCT 33.1* 08/04/2014   MCV 74.7* 08/04/2014   PLT 275 08/04/2014    Assessment / Plan: Spontaneous labor, progressing normally  Labor: Progressing normally Fetal Wellbeing:  Category I Pain Control:  Labor support without medications Pre-eclampsia: N/A I/D:  N/A Anticipated MOD:  NSVD  Rozelia Catapano SHWON Student NM 08/04/2014, 10:16 AM

## 2014-08-04 NOTE — Progress Notes (Signed)
ANTIBIOTIC CONSULT NOTE - FOLLOW UP  Pharmacy Consult for Gentamicin  Indication: Endometritis risk/ Manual placenta removal  Allergies  Allergen Reactions  . Penicillins Hives    Was given penicillin  After surgery, and broke out in a hives. She's not sure if it was from the penicillin.    Patient Measurements: Height: 5\' 1"  (154.9 cm) Weight: 179 lb (81.194 kg) IBW/kg (Calculated) : 47.8 Adjusted Body Weight: 57.8kg  Vital Signs: Temp: 100.3 F (37.9 C) (11/12 1820) Temp Source: Oral (11/12 1820) BP: 134/82 mmHg (11/12 1820) Pulse Rate: 90 (11/12 1820) Intake/Output from previous day:   Intake/Output from this shift: Total I/O In: -  Out: 800 [Blood:800]  Labs:  Recent Labs  08/01/14 2240 08/04/14 0435 08/04/14 1727  WBC 12.7* 11.2* 18.7*  HGB 10.6* 10.3* 9.7*  PLT 292 275 258   Estimated Creatinine Clearance: 89.4 mL/min (by C-G formula based on Cr of 0.76). No results for input(s): VANCOTROUGH, VANCOPEAK, VANCORANDOM, GENTTROUGH, GENTPEAK, GENTRANDOM, TOBRATROUGH, TOBRAPEAK, TOBRARND, AMIKACINPEAK, AMIKACINTROU, AMIKACIN in the last 72 hours.   Microbiology: Recent Results (from the past 720 hour(s))  OB RESULT CONSOLE Group B Strep     Status: None   Collection Time: 08/01/14 12:00 AM  Result Value Ref Range Status   GBS Negative  Final  Culture, beta strep (group b only)     Status: None   Collection Time: 08/01/14 10:32 PM  Result Value Ref Range Status   Specimen Description VAGINAL/RECTAL  Final   Special Requests NONE  Final   Culture   Final    NO GROUP B STREP (S.AGALACTIAE) ISOLATED Performed at [facility]    Report Status 08/03/2014 FINAL  Final  GC/Chlamydia Probe Amp (multiple spec sources)     Status: None   Collection Time: 08/01/14 10:32 PM  Result Value Ref Range Status   CT Probe RNA NEGATIVE NEGATIVE Final   GC Probe RNA NEGATIVE NEGATIVE Final    Comment: (NOTE)                              **Normal Reference Range: Negative**      Assay performed using the Gen-Probe APTIMA COMBO2 (R) Assay. Acceptable specimen types for this assay include APTIMA Swabs (Unisex, endocervical, urethral, or vaginal), first void urine, and ThinPrep liquid based cytology samples. Performed at [facility]     Anti-infectives    Start     Dose/Rate Route Frequency Ordered Stop   08/04/14 2200  clindamycin (CLEOCIN) IVPB 900 mg  Status:  Discontinued     900 mg100 mL/hr over 30 Minutes Intravenous 3 times per day 08/04/14 1828 08/04/14 1843   08/04/14 1900  gentamicin (GARAMYCIN) 140 mg, clindamycin (CLEOCIN) 900 mg in dextrose 5 % 100 mL IVPB     219 mL/hr over 30 Minutes Intravenous Every 8 hours 08/04/14 1845 08/05/14 1859      Assessment: 43 yo F s/p vaginal delivery with manual removal of placenta. Gentamicin and Clindamycin initiated due to potential risk for endometritis.  Goal of Therapy:  Gentamicin peaks 6-128mcg/ml and trough < 561mcg/ml.  Plan:  1. Gentamicin 140mg  IV q8h x 3 doses as ordered by MD. 2. No further follow up necessary unless Gentamicin continued after ordered 3 dose. Thanks!  Claybon Jabsngel, Belva Koziel G 08/04/2014,7:00 PM

## 2014-08-05 LAB — CBC
HCT: 30.2 % — ABNORMAL LOW (ref 36.0–46.0)
HEMOGLOBIN: 9.4 g/dL — AB (ref 12.0–15.0)
MCH: 23.2 pg — AB (ref 26.0–34.0)
MCHC: 31.1 g/dL (ref 30.0–36.0)
MCV: 74.4 fL — AB (ref 78.0–100.0)
Platelets: 270 10*3/uL (ref 150–400)
RBC: 4.06 MIL/uL (ref 3.87–5.11)
RDW: 17.9 % — ABNORMAL HIGH (ref 11.5–15.5)
WBC: 20 10*3/uL — ABNORMAL HIGH (ref 4.0–10.5)

## 2014-08-05 NOTE — Anesthesia Postprocedure Evaluation (Signed)
Anesthesia Post Note  Patient: Erin Ball  Procedure(s) Performed: * No procedures listed *  Anesthesia type: Epidural  Patient location: Mother/Baby  Post pain: Pain level controlled  Post assessment: Post-op Vital signs reviewed  Last Vitals:  Filed Vitals:   08/05/14 0630  BP: 119/74  Pulse: 73  Temp: 36.3 C  Resp:     Post vital signs: Reviewed  Level of consciousness:alert  Complications: No apparent anesthesia complications

## 2014-08-05 NOTE — Discharge Summary (Signed)
Obstetric Discharge Summary Reason for Admission: onset of labor and rupture of membranes Prenatal Procedures: none Intrapartum Procedures: spontaneous vaginal delivery Postpartum Procedures: antibiotics and cytotec for PPH (abx: gent and clindamycin) Complications-Operative and Postpartum: hemorrhage   43 y/o G4P4001 at [redacted]w[redacted]d presented in labor and with ROM went on to SVD with epidural anesthesia. 3 vessel cord. Manual removal of placenta. EBL: 800mL. PPH treated with cytotec and prx gent and clindamycin. No laceration. Apgars 8 and 9. Mom is bottle feeding. Wants POPs for birth control.   HEMOGLOBIN  Date Value Ref Range Status  08/05/2014 9.4* 12.0 - 15.0 g/dL Final   HCT  Date Value Ref Range Status  08/05/2014 30.2* 36.0 - 46.0 % Final    Physical Exam:  General: alert and cooperative  Cardiovascular: RRR. No murmurs, rubs, gallops Chest: Normal effort. Lungs CTA bilaterally  Abdomen: BSA. No tenderness to palpation.  Lochia: appropriate Uterine Fundus: firm DVT Evaluation: No evidence of DVT seen on physical exam. No cords or calf tenderness. Trace bilateral ankle edema  Discharge Diagnoses: Term Pregnancy-delivered  Discharge Information: Date: 08/05/2014 Activity: pelvic rest Diet: routine Medications: Ibuprofen and Iron Condition: stable Instructions: refer to practice specific booklet Discharge to: home   Newborn Data: Live born female  Birth Weight: 8 lb 3.6 oz (3731 g) APGAR: 8, 9  Home with mother.  Janalyn RouseHoward, Jordin Dambrosio 08/05/2014, 8:26 AM

## 2014-08-05 NOTE — Progress Notes (Signed)
UR chart review completed.  
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
## 2014-08-05 NOTE — Plan of Care (Signed)
Problem: Phase I Progression Outcomes Goal: Voiding adequately Outcome: Completed/Met Date Met:  08/05/14 Goal: OOB as tolerated unless otherwise ordered Outcome: Completed/Met Date Met:  08/05/14 Goal: VS, stable, temp < 100.4 degrees F Outcome: Completed/Met Date Met:  08/05/14 Goal: Initial discharge plan identified Outcome: Completed/Met Date Met:  08/05/14  Problem: Phase II Progression Outcomes Goal: Pain controlled on oral analgesia Outcome: Completed/Met Date Met:  08/05/14 Goal: Progress activity as tolerated unless otherwise ordered Outcome: Completed/Met Date Met:  08/05/14 Goal: Afebrile, VS remain stable Outcome: Completed/Met Date Met:  08/05/14 Goal: Incision intact & without signs/symptoms of infection Outcome: Not Applicable Date Met:  38/18/40 Goal: Rh isoimmunization per orders Outcome: Completed/Met Date Met:  08/05/14 Goal: Tolerating diet Outcome: Completed/Met Date Met:  08/05/14  Problem: Discharge Progression Outcomes Goal: MMR given as ordered Outcome: Not Applicable Date Met:  37/54/36

## 2014-08-05 NOTE — Progress Notes (Signed)
Post Partum Day 1 Subjective: no complaints, up ad lib, voiding, tolerating PO and + flatus. Pain is well controlled. Lochia: moderate. Denies nausea, vomiting, dizziness, or headaches. Has not had a bowel movement. Pt is bottle feeding. Plans on POPs for birth control.   Objective: Blood pressure 119/74, pulse 73, temperature 97.3 F (36.3 C), temperature source Oral, resp. rate 18, height 5\' 1"  (1.549 m), weight 81.194 kg (179 lb), SpO2 99 %, unknown if currently breastfeeding.  Physical Exam:  General: alert and cooperative  Cardiovascular: RRR. No murmurs, rubs, gallops.  Chest: Normal effort. Lungs CTA bilaterally  Lochia: appropriate Uterine Fundus: firm DVT Evaluation: No evidence of DVT seen on physical exam. No cords or calf tenderness. Trace ankle edema bilaterally.    Recent Labs  08/04/14 1727 08/05/14 0612  HGB 9.7* 9.4*  HCT 31.2* 30.2*    Assessment/Plan: Discharge home   LOS: 1 day   Janalyn RouseHoward, Compton Brigance 08/05/2014, 7:53 AM

## 2014-08-06 MED ORDER — NORGESTIMATE-ETH ESTRADIOL 0.25-35 MG-MCG PO TABS: 1.0000 | ORAL_TABLET | Freq: Every day | ORAL | Status: DC

## 2014-08-06 NOTE — Discharge Summary (Signed)
Obstetric Discharge Summary Reason for Admission: onset of labor Prenatal Procedures: none Intrapartum Procedures: spontaneous vaginal delivery Postpartum Procedures: none Complications-Operative and Postpartum: none  Delivery Note At 4:01 PM a viable female was delivered via Vaginal, Spontaneous Delivery (Presentation: Right Occiput Anterior).  APGAR: 8, 9; weight 8 lb 3.6 oz (3731 g).   Placenta status: Intact, Manual removal.  Cord: 3 vessels with the following complications: None.  Anesthesia: Epidural  Episiotomy: None Lacerations: None Suture Repair: n/a Est. Blood Loss (mL): 800  Mom to postpartum.  Baby to Couplet care / Skin to Skin.  Thea Holshouser ROCIO 08/06/2014, 11:33 AM     Hospital Course:  Principal Problem:   Vaginal delivery Active Problems:   Active labor   Today: No acute events overnight.  Pt denies problems with ambulating, voiding or po intake.  She denies nausea or vomiting.  Pain is well controlled.  She has had flatus.  Lochia Small.  Plan for birth control is  OCPs.  Method of Feeding: bottle  Macario Goldsllison C Bayona is a 43 y.o. G4P4001 s/p SVD.  Patient presented to OBT with ROM after essentially no prenatal care and was admitted to L&D.  She has postpartum course that was uncomplicated including no problems with ambulating, PO intake, urination, pain, or bleeding. The pt feels ready to go home and  will be discharged with outpatient follow-up.    H/H: Lab Results  Component Value Date/Time   HGB 9.4* 08/05/2014 06:12 AM   HCT 30.2* 08/05/2014 06:12 AM    Discharge Diagnoses: Term Pregnancy-delivered  Discharge Information: Date: 08/06/2014 Activity: pelvic rest Diet: routine  Medications: None Breast feeding:  No Condition: stable Instructions: refer to handout Discharge to: home       Discharge Instructions    Call MD for:  severe uncontrolled pain    Complete by:  As directed      Call MD for:  temperature >100.4    Complete  by:  As directed      Diet - low sodium heart healthy    Complete by:  As directed             Medication List    TAKE these medications        acetaminophen 325 MG tablet  Commonly known as:  TYLENOL  Take 650 mg by mouth every 6 (six) hours as needed for mild pain or headache.     norgestimate-ethinyl estradiol 0.25-35 MG-MCG tablet  Commonly known as:  SPRINTEC 28  Take 1 tablet by mouth daily.     oxyCODONE-acetaminophen 5-325 MG per tablet  Commonly known as:  ROXICET  Take 1 tablet by mouth every 6 (six) hours as needed for severe pain.     prenatal multivitamin Tabs tablet  Take 1 tablet by mouth daily at 12 noon.       Follow-up Information    Follow up with [facility]. Schedule an appointment as soon as possible for a visit in 6 weeks.   Why:  for postpartum care   Contact information:   [address] [place] [id number]       Perry MountACOSTA,Masai Kidd ROCIO ,MD OB Fellow 08/06/2014,11:38 AM

## 2014-08-06 NOTE — Discharge Instructions (Signed)

## 2014-08-09 ENCOUNTER — Other Ambulatory Visit: Payer: Self-pay

## 2014-08-16 ENCOUNTER — Telehealth: Payer: Self-pay | Admitting: *Deleted

## 2014-08-16 ENCOUNTER — Encounter: Payer: Self-pay | Admitting: *Deleted

## 2014-08-16 NOTE — Telephone Encounter (Signed)
Pt called nurse line and states she needs a work to go back to work.  Attempted to contact patient, no answer, left message for patient to call clinic, information requested how long did she desire to be out of work, She is postpartum 2 wks. Pt can leave message on voicemail.

## 2014-08-16 NOTE — Telephone Encounter (Signed)
Pt called and states she would like to return to work on 08/29/14.  She will come and pick up the letter for her work.  D. Poe confirmed patient may be released back to work.

## 2015-08-13 ENCOUNTER — Emergency Department (HOSPITAL_COMMUNITY): Payer: No Typology Code available for payment source

## 2015-08-13 ENCOUNTER — Observation Stay (HOSPITAL_COMMUNITY)
Admission: EM | Admit: 2015-08-13 | Discharge: 2015-08-14 | Disposition: A | Discharge status: Home / Self Care | Payer: No Typology Code available for payment source | Attending: Orthopedic Surgery | Admitting: Orthopedic Surgery

## 2015-08-13 ENCOUNTER — Emergency Department (HOSPITAL_COMMUNITY): Payer: No Typology Code available for payment source | Admitting: Anesthesiology

## 2015-08-13 ENCOUNTER — Encounter (HOSPITAL_COMMUNITY)
Admission: EM | Disposition: A | Discharge status: Home / Self Care | Payer: Self-pay | Source: Home / Self Care | Attending: Emergency Medicine

## 2015-08-13 ENCOUNTER — Encounter (HOSPITAL_COMMUNITY): Payer: Self-pay | Admitting: Emergency Medicine

## 2015-08-13 DIAGNOSIS — S62501A Fracture of unspecified phalanx of right thumb, initial encounter for closed fracture: Secondary | ICD-10-CM | POA: Insufficient documentation

## 2015-08-13 DIAGNOSIS — S5292XD Unspecified fracture of left forearm, subsequent encounter for closed fracture with routine healing: Secondary | ICD-10-CM

## 2015-08-13 DIAGNOSIS — S52202D Unspecified fracture of shaft of left ulna, subsequent encounter for closed fracture with routine healing: Secondary | ICD-10-CM

## 2015-08-13 DIAGNOSIS — S52302A Unspecified fracture of shaft of left radius, initial encounter for closed fracture: Principal | ICD-10-CM | POA: Insufficient documentation

## 2015-08-13 DIAGNOSIS — S5292XA Unspecified fracture of left forearm, initial encounter for closed fracture: Secondary | ICD-10-CM

## 2015-08-13 DIAGNOSIS — Y92410 Unspecified street and highway as the place of occurrence of the external cause: Secondary | ICD-10-CM | POA: Insufficient documentation

## 2015-08-13 DIAGNOSIS — S5290XA Unspecified fracture of unspecified forearm, initial encounter for closed fracture: Secondary | ICD-10-CM | POA: Diagnosis present

## 2015-08-13 DIAGNOSIS — Z79899 Other long term (current) drug therapy: Secondary | ICD-10-CM | POA: Diagnosis not present

## 2015-08-13 DIAGNOSIS — S52202A Unspecified fracture of shaft of left ulna, initial encounter for closed fracture: Secondary | ICD-10-CM | POA: Insufficient documentation

## 2015-08-13 DIAGNOSIS — S52209A Unspecified fracture of shaft of unspecified ulna, initial encounter for closed fracture: Secondary | ICD-10-CM | POA: Diagnosis present

## 2015-08-13 DIAGNOSIS — Z87891 Personal history of nicotine dependence: Secondary | ICD-10-CM | POA: Insufficient documentation

## 2015-08-13 HISTORY — PX: CLOSED REDUCTION FINGER WITH PERCUTANEOUS PINNING: SHX5612

## 2015-08-13 HISTORY — PX: ORIF ULNAR FRACTURE: SHX5417

## 2015-08-13 LAB — BASIC METABOLIC PANEL
Anion gap: 9 (ref 5–15)
BUN: 13 mg/dL (ref 6–20)
CALCIUM: 8.8 mg/dL — AB (ref 8.9–10.3)
CHLORIDE: 106 mmol/L (ref 101–111)
CO2: 21 mmol/L — ABNORMAL LOW (ref 22–32)
Creatinine, Ser: 0.72 mg/dL (ref 0.44–1.00)
GFR calc non Af Amer: >60 mL/min (ref >60)
Glucose, Bld: 137 mg/dL — ABNORMAL HIGH (ref 65–99)
Potassium: 3.2 mmol/L — ABNORMAL LOW (ref 3.5–5.1)
Sodium: 136 mmol/L (ref 135–145)

## 2015-08-13 LAB — CBC WITH DIFFERENTIAL/PLATELET
BASOS ABS: 0 10*3/uL (ref 0.0–0.1)
BASOS PCT: 0 %
Eosinophils Absolute: 0 10*3/uL (ref 0.0–0.7)
Eosinophils Relative: 0 %
HEMATOCRIT: 40.7 % (ref 36.0–46.0)
HEMOGLOBIN: 13.8 g/dL (ref 12.0–15.0)
LYMPHS PCT: 9 %
Lymphs Abs: 1.1 10*3/uL (ref 0.7–4.0)
MCH: 29.7 pg (ref 26.0–34.0)
MCHC: 33.9 g/dL (ref 30.0–36.0)
MCV: 87.7 fL (ref 78.0–100.0)
Monocytes Absolute: 0.8 10*3/uL (ref 0.1–1.0)
Monocytes Relative: 6 %
NEUTROS ABS: 10.5 10*3/uL — AB (ref 1.7–7.7)
NEUTROS PCT: 85 %
Platelets: 251 10*3/uL (ref 150–400)
RBC: 4.64 MIL/uL (ref 3.87–5.11)
RDW: 13.5 % (ref 11.5–15.5)
WBC: 12.4 10*3/uL — ABNORMAL HIGH (ref 4.0–10.5)

## 2015-08-13 SURGERY — OPEN REDUCTION INTERNAL FIXATION (ORIF) ULNAR FRACTURE
Anesthesia: General | Laterality: Right

## 2015-08-13 MED ORDER — DEXAMETHASONE SODIUM PHOSPHATE 10 MG/ML IJ SOLN
INTRAMUSCULAR | Status: AC
  Filled 2015-08-13: qty 1

## 2015-08-13 MED ORDER — ACETAMINOPHEN 10 MG/ML IV SOLN
INTRAVENOUS | Status: DC | PRN
  Administered 2015-08-13: 1000 mg via INTRAVENOUS

## 2015-08-13 MED ORDER — VANCOMYCIN HCL IN DEXTROSE 1-5 GM/200ML-% IV SOLN: 1000.0000 mg | Freq: Once | INTRAVENOUS | Status: DC

## 2015-08-13 MED ORDER — LIDOCAINE HCL (CARDIAC) 20 MG/ML IV SOLN
INTRAVENOUS | Status: AC
  Filled 2015-08-13: qty 5

## 2015-08-13 MED ORDER — MIDAZOLAM HCL 5 MG/5ML IJ SOLN
INTRAMUSCULAR | Status: DC | PRN
  Administered 2015-08-13 (×2): 1 mg via INTRAVENOUS

## 2015-08-13 MED ORDER — ONDANSETRON HCL 4 MG/2ML IJ SOLN
INTRAMUSCULAR | Status: AC
  Filled 2015-08-13: qty 2

## 2015-08-13 MED ORDER — VANCOMYCIN HCL 1000 MG IV SOLR
1000.0000 mg | Freq: Once | INTRAVENOUS | Status: DC
  Administered 2015-08-13: 1000 mg via INTRAVENOUS

## 2015-08-13 MED ORDER — MIDAZOLAM HCL 2 MG/2ML IJ SOLN
INTRAMUSCULAR | Status: AC
  Filled 2015-08-13: qty 2

## 2015-08-13 MED ORDER — PROPOFOL 10 MG/ML IV BOLUS
INTRAVENOUS | Status: AC
  Filled 2015-08-13: qty 20

## 2015-08-13 MED ORDER — LIDOCAINE HCL 2 % IJ SOLN
20.0000 mL | Freq: Once | INTRAMUSCULAR | Status: AC
  Administered 2015-08-13: 400 mg via INTRADERMAL
  Filled 2015-08-13: qty 20

## 2015-08-13 MED ORDER — SUFENTANIL CITRATE 50 MCG/ML IV SOLN
INTRAVENOUS | Status: AC
  Filled 2015-08-13: qty 1

## 2015-08-13 MED ORDER — VANCOMYCIN HCL IN DEXTROSE 1-5 GM/200ML-% IV SOLN
INTRAVENOUS | Status: AC
  Filled 2015-08-13: qty 200

## 2015-08-13 MED ORDER — LACTATED RINGERS IV SOLN
INTRAVENOUS | Status: DC | PRN
  Administered 2015-08-13: 23:00:00 via INTRAVENOUS

## 2015-08-13 MED ORDER — HYDROMORPHONE HCL 1 MG/ML IJ SOLN
0.5000 mg | Freq: Once | INTRAMUSCULAR | Status: AC
  Administered 2015-08-13: 0.5 mg via INTRAVENOUS
  Filled 2015-08-13: qty 1

## 2015-08-13 MED ORDER — SODIUM CHLORIDE 0.9 % IJ SOLN
INTRAMUSCULAR | Status: AC
  Filled 2015-08-13: qty 10

## 2015-08-13 SURGICAL SUPPLY — 95 items
BAG ZIPLOCK 12X15 (MISCELLANEOUS) ×4 IMPLANT
BANDAGE COBAN STERILE 2 (GAUZE/BANDAGES/DRESSINGS) ×4 IMPLANT
BANDAGE ELASTIC 3 VELCRO ST LF (GAUZE/BANDAGES/DRESSINGS) IMPLANT
BANDAGE ELASTIC 4 VELCRO ST LF (GAUZE/BANDAGES/DRESSINGS) ×8 IMPLANT
BIT DRILL 2.5X2.75 QC CALB (BIT) ×4 IMPLANT
BIT DRILL CALIBRATED 2.7 (BIT) ×3 IMPLANT
BIT DRILL CALIBRATED 2.7MM (BIT) ×1
BLADE SURG 15 STRL LF DISP TIS (BLADE) ×4 IMPLANT
BLADE SURG 15 STRL SS (BLADE) ×4
BLADE SURG SZ10 CARB STEEL (BLADE) ×4 IMPLANT
BNDG CONFORM 2 STRL LF (GAUZE/BANDAGES/DRESSINGS) ×4 IMPLANT
BNDG GAUZE ELAST 4 BULKY (GAUZE/BANDAGES/DRESSINGS) ×4 IMPLANT
CORDS BIPOLAR (ELECTRODE) ×4 IMPLANT
CUFF TOURN SGL QUICK 18 (TOURNIQUET CUFF) ×4 IMPLANT
DECANTER SPIKE VIAL GLASS SM (MISCELLANEOUS) IMPLANT
DRAIN PENROSE 18X1/4 LTX STRL (WOUND CARE) IMPLANT
DRAPE LG THREE QUARTER DISP (DRAPES) ×8 IMPLANT
DRAPE OEC MINIVIEW 54X84 (DRAPES) ×4 IMPLANT
DRAPE SURG 17X11 SM STRL (DRAPES) ×8 IMPLANT
DRAPE U-SHAPE 47X51 STRL (DRAPES) ×4 IMPLANT
DRSG ADAPTIC 3X8 NADH LF (GAUZE/BANDAGES/DRESSINGS) ×4 IMPLANT
DRSG EMULSION OIL 3X16 NADH (GAUZE/BANDAGES/DRESSINGS) ×4 IMPLANT
DRSG PAD ABDOMINAL 8X10 ST (GAUZE/BANDAGES/DRESSINGS) IMPLANT
ELECT REM PT RETURN 9FT ADLT (ELECTROSURGICAL) ×4
ELECTRODE REM PT RTRN 9FT ADLT (ELECTROSURGICAL) ×2 IMPLANT
EVACUATOR 1/8 PVC DRAIN (DRAIN) IMPLANT
GAUZE SPONGE 4X4 12PLY STRL (GAUZE/BANDAGES/DRESSINGS) ×4 IMPLANT
GAUZE SPONGE 4X4 16PLY XRAY LF (GAUZE/BANDAGES/DRESSINGS) ×4 IMPLANT
GAUZE XEROFORM 5X9 LF (GAUZE/BANDAGES/DRESSINGS) ×4 IMPLANT
GLOVE BIO SURGEON STRL SZ8 (GLOVE) IMPLANT
GOWN STRL REUS W/TWL XL LVL3 (GOWN DISPOSABLE) ×4 IMPLANT
HANDPIECE INTERPULSE COAX TIP (DISPOSABLE)
K-WIRE CAPS BLUE STERILE .035 (WIRE)
K-WIRE CAPS STERILE WHITE .045 (WIRE) IMPLANT
K-WIRE CAPS STERILE YELLOW .02 (WIRE)
K-WIRE SMTH SNGL TROCAR .028X4 (WIRE)
KIT BASIN OR (CUSTOM PROCEDURE TRAY) ×4 IMPLANT
KWIRE 4.0 X .035IN (WIRE) IMPLANT
KWIRE 4.0 X .045IN (WIRE) IMPLANT
KWIRE 4.0 X .062IN (WIRE) IMPLANT
KWIRE CAPS BLUE STRL .035 (WIRE) IMPLANT
KWIRE CAPS STRL YELLOW .02 (WIRE) IMPLANT
KWIRE SMTH SNGL TROCAR .028X4 (WIRE) IMPLANT
MANIFOLD NEPTUNE II (INSTRUMENTS) ×4 IMPLANT
NEEDLE ANCHOR KEITH 2 7/8 STR (NEEDLE) IMPLANT
NS IRRIG 1000ML POUR BTL (IV SOLUTION) ×4 IMPLANT
PACK ORTHO EXTREMITY (CUSTOM PROCEDURE TRAY) ×4 IMPLANT
PAD CAST 3X4 CTTN HI CHSV (CAST SUPPLIES) IMPLANT
PAD CAST 4YDX4 CTTN HI CHSV (CAST SUPPLIES) IMPLANT
PADDING CAST ABS 4INX4YD NS (CAST SUPPLIES) ×6
PADDING CAST ABS COTTON 4X4 ST (CAST SUPPLIES) ×6 IMPLANT
PADDING CAST COTTON 3X4 STRL (CAST SUPPLIES)
PADDING CAST COTTON 4X4 STRL (CAST SUPPLIES)
PADDING UNDERCAST 2 STRL (CAST SUPPLIES) ×2
PADDING UNDERCAST 2X4 STRL (CAST SUPPLIES) ×2 IMPLANT
PIN CAPS ORTHO GREEN .062 (PIN) IMPLANT
PLATE LOCK COMP 7H FOOT (Plate) ×4 IMPLANT
PLATE LOCK COMP 9H 3.5 FOOT (Plate) ×4 IMPLANT
POSITIONER SURGICAL ARM (MISCELLANEOUS) ×4 IMPLANT
SCREW CORTICAL 3.5MM  16MM (Screw) ×2 IMPLANT
SCREW CORTICAL 3.5MM 14MM (Screw) ×12 IMPLANT
SCREW CORTICAL 3.5MM 16MM (Screw) ×2 IMPLANT
SCREW LOCK CORT STAR 3.5X10 (Screw) ×20 IMPLANT
SCREW LOCK CORT STAR 3.5X12 (Screw) ×12 IMPLANT
SET HNDPC FAN SPRY TIP SCT (DISPOSABLE) IMPLANT
SPLINT FIBERGLASS 5X30 (CAST SUPPLIES) ×4 IMPLANT
SPLINT FINGER 3.25 911903 (SOFTGOODS) ×4 IMPLANT
SPONGE SURGIFOAM ABS GEL 100 (HEMOSTASIS) IMPLANT
SUCTION FRAZIER 12FR DISP (SUCTIONS) ×4 IMPLANT
SUT BONE WAX W31G (SUTURE) IMPLANT
SUT ETHILON 5 0 P 3 18 (SUTURE)
SUT ETHILON 6 0 PS 3 18 (SUTURE) IMPLANT
SUT MERSILENE 4 0 P 3 (SUTURE) IMPLANT
SUT MNCRL AB 4-0 PS2 18 (SUTURE) ×4 IMPLANT
SUT NYLON ETHILON 5-0 P-3 1X18 (SUTURE) IMPLANT
SUT PROLENE 3 0 PS 1 (SUTURE) ×24 IMPLANT
SUT PROLENE 3 0 PS 2 (SUTURE) IMPLANT
SUT PROLENE 4 0 P 3 18 (SUTURE) IMPLANT
SUT PROLENE 4 0 RB 1 (SUTURE) ×2
SUT PROLENE 4-0 RB1 .5 CRCL 36 (SUTURE) ×2 IMPLANT
SUT VIC AB 0 CT1 27 (SUTURE)
SUT VIC AB 0 CT1 27XBRD ANTBC (SUTURE) IMPLANT
SUT VIC AB 2-0 CT1 27 (SUTURE)
SUT VIC AB 2-0 CT1 TAPERPNT 27 (SUTURE) IMPLANT
SUT VIC AB 3-0 PS2 18 (SUTURE) ×6
SUT VIC AB 3-0 PS2 18XBRD (SUTURE) ×6 IMPLANT
SUT VIC AB 3-0 SH 27 (SUTURE) ×2
SUT VIC AB 3-0 SH 27X BRD (SUTURE) ×2 IMPLANT
SWAB COLLECTION DEVICE MRSA (MISCELLANEOUS) IMPLANT
SWAB CULTURE ESWAB REG 1ML (MISCELLANEOUS) ×4 IMPLANT
SYR CONTROL 10ML LL (SYRINGE) IMPLANT
SYSTEM CHEST DRAIN TLS 7FR (DRAIN) IMPLANT
TOWEL OR 17X26 10 PK STRL BLUE (TOWEL DISPOSABLE) ×8 IMPLANT
UNDERPAD 30X30 INCONTINENT (UNDERPADS AND DIAPERS) ×4 IMPLANT
WATER STERILE IRR 1500ML POUR (IV SOLUTION) IMPLANT
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
## 2015-08-13 NOTE — ED Notes (Signed)
Pt's belongings placed in 2 white bags---- locked in locker# 26 in TCU.  Pt's mother, Corrie DandyMary, to come and pick them up.

## 2015-08-13 NOTE — Anesthesia Preprocedure Evaluation (Addendum)
Anesthesia Evaluation  Patient identified by MRN, date of birth, ID band Patient awake    Reviewed: Allergy & Precautions, H&P , NPO status , Patient's Chart, lab work & pertinent test results  Airway Mallampati: II  TM Distance: >3 FB Neck ROM: full    Dental no notable dental hx. (+) Dental Advisory Given, Teeth Intact   Pulmonary neg pulmonary ROS, former smoker,    Pulmonary exam normal breath sounds clear to auscultation       Cardiovascular Exercise Tolerance: Good negative cardio ROS Normal cardiovascular exam Rhythm:regular Rate:Normal     Neuro/Psych negative neurological ROS  negative psych ROS   GI/Hepatic negative GI ROS, Neg liver ROS,   Endo/Other  negative endocrine ROS  Renal/GU negative Renal ROS  negative genitourinary   Musculoskeletal   Abdominal   Peds  Hematology negative hematology ROS (+)   Anesthesia Other Findings   Reproductive/Obstetrics negative OB ROS                            Anesthesia Physical Anesthesia Plan  ASA: I  Anesthesia Plan: General   Post-op Pain Management:    Induction: Intravenous  Airway Management Planned: Oral ETT  Additional Equipment:   Intra-op Plan:   Post-operative Plan: Extubation in OR  Informed Consent: I have reviewed the patients History and Physical, chart, labs and discussed the procedure including the risks, benefits and alternatives for the proposed anesthesia with the patient or authorized representative who has indicated his/her understanding and acceptance.   Dental Advisory Given  Plan Discussed with: CRNA and Surgeon  Anesthesia Plan Comments:        Anesthesia Quick Evaluation

## 2015-08-14 ENCOUNTER — Encounter (HOSPITAL_COMMUNITY): Payer: Self-pay | Admitting: *Deleted

## 2015-08-14 DIAGNOSIS — S5290XA Unspecified fracture of unspecified forearm, initial encounter for closed fracture: Secondary | ICD-10-CM | POA: Diagnosis present

## 2015-08-14 DIAGNOSIS — S52209A Unspecified fracture of shaft of unspecified ulna, initial encounter for closed fracture: Secondary | ICD-10-CM | POA: Diagnosis present

## 2015-08-14 MED ORDER — 0.9 % SODIUM CHLORIDE (POUR BTL) OPTIME
TOPICAL | Status: DC | PRN
  Administered 2015-08-14: 1000 mL

## 2015-08-14 MED ORDER — METHOCARBAMOL 500 MG PO TABS
500.0000 mg | ORAL_TABLET | Freq: Four times a day (QID) | ORAL | Status: DC | PRN
Start: 2015-08-14 — End: 2015-08-14
  Filled 2015-08-14: qty 1

## 2015-08-14 MED ORDER — SODIUM CHLORIDE 0.9 % IJ SOLN
INTRAMUSCULAR | Status: AC
Start: 2015-08-14 — End: 2015-08-14
  Filled 2015-08-14: qty 10

## 2015-08-14 MED ORDER — ONDANSETRON HCL 4 MG/2ML IJ SOLN: 4.0000 mg | Freq: Four times a day (QID) | INTRAMUSCULAR | Status: DC | PRN

## 2015-08-14 MED ORDER — SULFAMETHOXAZOLE-TRIMETHOPRIM 800-160 MG PO TABS: 1.0000 | ORAL_TABLET | Freq: Two times a day (BID) | ORAL | Status: DC

## 2015-08-14 MED ORDER — SUFENTANIL CITRATE 50 MCG/ML IV SOLN
INTRAVENOUS | Status: DC | PRN
  Administered 2015-08-13: 20 ug via INTRAVENOUS
  Administered 2015-08-14 (×3): 10 ug via INTRAVENOUS

## 2015-08-14 MED ORDER — PROMETHAZINE HCL 25 MG RE SUPP: 12.5000 mg | Freq: Four times a day (QID) | RECTAL | Status: DC | PRN

## 2015-08-14 MED ORDER — PROPOFOL 10 MG/ML IV BOLUS
INTRAVENOUS | Status: DC | PRN
  Administered 2015-08-13: 150 mg via INTRAVENOUS

## 2015-08-14 MED ORDER — ONDANSETRON HCL 4 MG/2ML IJ SOLN
INTRAMUSCULAR | Status: DC | PRN
  Administered 2015-08-14: 4 mg via INTRAVENOUS

## 2015-08-14 MED ORDER — LIDOCAINE HCL (CARDIAC) 20 MG/ML IV SOLN
INTRAVENOUS | Status: DC | PRN
  Administered 2015-08-13: 100 mg via INTRAVENOUS

## 2015-08-14 MED ORDER — OXYCODONE HCL 5 MG PO TABS: 5.0000 mg | ORAL_TABLET | ORAL | Status: DC | PRN

## 2015-08-14 MED ORDER — HYDROMORPHONE HCL 2 MG/ML IJ SOLN
INTRAMUSCULAR | Status: AC
  Filled 2015-08-14: qty 1

## 2015-08-14 MED ORDER — SUCCINYLCHOLINE CHLORIDE 20 MG/ML IJ SOLN
INTRAMUSCULAR | Status: DC | PRN
  Administered 2015-08-13: 100 mg via INTRAVENOUS

## 2015-08-14 MED ORDER — CISATRACURIUM BESYLATE (PF) 10 MG/5ML IV SOLN
INTRAVENOUS | Status: DC | PRN
  Administered 2015-08-14: 6 mg via INTRAVENOUS

## 2015-08-14 MED ORDER — METHOCARBAMOL 500 MG PO TABS: 500.0000 mg | ORAL_TABLET | Freq: Four times a day (QID) | ORAL | Status: DC | PRN

## 2015-08-14 MED ORDER — ALPRAZOLAM 0.5 MG PO TABS: 0.5000 mg | ORAL_TABLET | Freq: Four times a day (QID) | ORAL | Status: DC | PRN

## 2015-08-14 MED ORDER — DOCUSATE SODIUM 100 MG PO CAPS
100.0000 mg | ORAL_CAPSULE | Freq: Two times a day (BID) | ORAL | Status: DC
  Administered 2015-08-14: 100 mg via ORAL

## 2015-08-14 MED ORDER — ONDANSETRON HCL 4 MG PO TABS: 4.0000 mg | ORAL_TABLET | Freq: Four times a day (QID) | ORAL | Status: DC | PRN

## 2015-08-14 MED ORDER — FENTANYL CITRATE (PF) 100 MCG/2ML IJ SOLN: 25.0000 ug | INTRAMUSCULAR | Status: DC | PRN

## 2015-08-14 MED ORDER — DEXTROSE 5 % IV SOLN
500.0000 mg | Freq: Four times a day (QID) | INTRAVENOUS | Status: DC | PRN
  Administered 2015-08-14: 500 mg via INTRAVENOUS
  Filled 2015-08-14 (×3): qty 5

## 2015-08-14 MED ORDER — DEXAMETHASONE SODIUM PHOSPHATE 10 MG/ML IJ SOLN
INTRAMUSCULAR | Status: DC | PRN
  Administered 2015-08-13: 10 mg via INTRAVENOUS

## 2015-08-14 MED ORDER — MORPHINE SULFATE (PF) 2 MG/ML IV SOLN
1.0000 mg | INTRAVENOUS | Status: DC | PRN
  Administered 2015-08-14 (×2): 1 mg via INTRAVENOUS
  Filled 2015-08-14 (×2): qty 1

## 2015-08-14 MED ORDER — HYDROMORPHONE HCL 1 MG/ML IJ SOLN
INTRAMUSCULAR | Status: DC | PRN
  Administered 2015-08-14 (×2): 0.5 mg via INTRAVENOUS

## 2015-08-14 MED ORDER — LACTATED RINGERS IV SOLN
INTRAVENOUS | Status: DC
  Administered 2015-08-14 (×2): via INTRAVENOUS

## 2015-08-14 MED ORDER — VANCOMYCIN HCL IN DEXTROSE 750-5 MG/150ML-% IV SOLN
750.0000 mg | Freq: Three times a day (TID) | INTRAVENOUS | Status: DC
  Administered 2015-08-14 (×2): 750 mg via INTRAVENOUS
  Filled 2015-08-14 (×2): qty 150

## 2015-08-14 MED ORDER — FAMOTIDINE 20 MG PO TABS
20.0000 mg | ORAL_TABLET | Freq: Two times a day (BID) | ORAL | Status: DC | PRN
  Filled 2015-08-14: qty 1

## 2015-08-14 MED ORDER — VITAMIN C 500 MG PO TABS
1000.0000 mg | ORAL_TABLET | Freq: Every day | ORAL | Status: DC
  Administered 2015-08-14: 1000 mg via ORAL
  Filled 2015-08-14: qty 2

## 2015-08-14 MED ORDER — LACTATED RINGERS IV SOLN: INTRAVENOUS | Status: DC
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
## 2015-08-14 NOTE — Discharge Summary (Signed)
Physician Discharge Summary  Patient ID: Erin Ball MRN: [id number] DOB/AGE: 1971/07/31 44 y.o.  Admit date: 08/13/2015 Discharge date: 08/14/2015  Admission Diagnoses: 1. Left closed both-bone forearm fracture displaced. 2. Right thumb closed fracture dislocation of the interphalangeal  joint Patient Active Problem List   Diagnosis Date Noted  . Forearm fractures, both bones, closed 08/14/2015  . Radius fracture 08/14/2015  . Active labor 08/04/2014  . Vaginal delivery 08/04/2014  . Late prenatal care affecting pregnancy in third trimester, antepartum   . Advanced maternal age in multigravida   . Encounter for routine screening for malformation using ultrasonics   . [redacted] weeks gestation of pregnancy   . Menorrhagia 10/23/2012    Discharge Diagnoses:  See above Active Problems:   Forearm fractures, both bones, closed   Radius fracture   Discharged Condition: stable  Hospital Course: The patient is a pleasant 44 year old female who presented to the emergency room setting with's placed left radius and ulna forearm fractures as well as an unstable IP joint about the right thumb. The patient was consulted her hand surgery and urgent surgical intervention was recommended. The patient went to the operative suite and underwent open reduction internal fixation about her left both bone forearm fracture as well as closed reduction and pinning about the right thumb IP dislocation. She tolerated the procedure well and there were no comp cases, please see operative report for full details. The patient was ultimately admitted to the orthopedic unit for close observation, IV antibiotics and pain management. Postoperative day #1 the patient was in excellent spirits, her pain was well controlled, she was tolerating a regular diet voiding without difficulties. Discussed with her all issues in regards to her injury and out of work status at this juncture. Patient quested to be discharged home  to the care of her mother. He is going examination of the left upper extremity showed that her splint, dressings, wear clean dry and intact. Digital range of motion look excellent sensation refill are intact about the hand. Evaluation the right thumb show better bulky dressings were intact. I did give her extra dressing supplies should this loosen. I've also instructed her if the thumb dressing becomes loose or tends to piston to contact us so we can redress this as soon as possible. We discussed all discharge issues with her a sling was dispensed to left upper extremity. The patient understands she must leave the dressings on keep them clean dry and intact she should not remove these. When overall discharge issues with her at length all questions were encouraged and answered  Consults: none  Treatments: 1. Open reduction and internal fixation, radius and ulna, left forearm  secondary to a displaced closed both-bone forearm fracture. 2. Stress radiography/AP, lateral, and oblique x-rays performed,  examined, and interpreted by myself, left forearm. 3. Open reduction and internal fixation, interphalangeal joint  dislocation, unstable about the right thumb. 4. AP, lateral, and oblique x-rays performed, examined, and  interpreted by myself, right thumb.  Discharge Exam: Blood pressure 110/71, pulse 90, temperature 98.8 F (37.1 C), temperature source Oral, resp. rate 16, height 5\' 1"  (1.549 m), weight 69.4 kg (153 lb), SpO2 98 %, currently breastfeeding. The patient is pleasant in no acute distress, very conversant, alert and oriented Please see hospital course for upper extremity physical examination The patient is alert and oriented in no acute distress. The patient complains of pain in the affected upper extremity.  The patient is noted to have a normal HEENT exam.  Lung fields show equal chest expansion and no shortness of breath. Abdomen exam is nontender without distention. Lower  extremity examination does not show any fracture dislocation or blood clot symptoms. Pelvis is stable and the neck and back are stable and nontender.  Disposition: 01-Home or Self Care  Discharge Instructions    Call MD / Call 911    Complete by:  As directed   If you experience chest pain or shortness of breath, CALL 911 and be transported to the hospital emergency room.  If you develope a fever above 101 F, pus (white drainage) or increased drainage or redness at the wound, or calf pain, call your surgeon's office.     Constipation Prevention    Complete by:  As directed   Drink plenty of fluids.  Prune juice may be helpful.  You may use a stool softener, such as Colace (over the counter) 100 mg twice a day.  Use MiraLax (over the counter) for constipation as needed.     Diet - low sodium heart healthy    Complete by:  As directed      Discharge instructions    Complete by:  As directed   Keep bandage clean and dry.  Call for any problems.  No smoking.  Criteria for driving a car: you should be off your pain medicine for 7-8 hours, able to drive one handed(confident), thinking clearly and feeling able in your judgement to drive. Continue elevation as it will decrease swelling.  If instructed by MD move your fingers within the confines of the bandage/splint.  Use ice if instructed by your MD. Call immediately for any sudden loss of feeling in your hand/arm or change in functional abilities of the extremity. We recommend that you to take vitamin C 1000 mg a day to promote healing. We also recommend that if you require  pain medicine that you take a stool softener to prevent constipation as most pain medicines will have constipation side effects. We recommend either Peri-Colace or Senokot and recommend that you also consider adding MiraLAX to prevent the constipation affects from pain medicine if you are required to use them. These medicines are over the counter and maybe purchased at a local  pharmacy. A cup of yogurt and a probiotic can also be helpful during the recovery process as the medicines can disrupt your intestinal environment.     Increase activity slowly as tolerated    Complete by:  As directed      Sling    Complete by:  As directed             Medication List    TAKE these medications        methocarbamol 500 MG tablet  Commonly known as:  ROBAXIN  Take 1 tablet (500 mg total) by mouth every 6 (six) hours as needed for muscle spasms.     norgestimate-ethinyl estradiol 0.25-35 MG-MCG tablet  Commonly known as:  SPRINTEC 28  Take 1 tablet by mouth daily.     oxyCODONE 5 MG immediate release tablet  Commonly known as:  Oxy IR/ROXICODONE  Take 1-2 tablets (5-10 mg total) by mouth every 3 (three) hours as needed for moderate pain.     prenatal multivitamin Tabs tablet  Take 1 tablet by mouth daily at 12 noon.     sulfamethoxazole-trimethoprim 800-160 MG tablet  Commonly known as:  BACTRIM DS,SEPTRA DS  Take 1 tablet by mouth 2 (two) times daily.  Follow-up Information    Follow up with Karen Chafe, MD In 12 days.   Specialty:  Orthopedic Surgery   Why:  our office will call you with appt date and time   Contact information:   [address] [place] [address] [phone]       Signed: Sheran Lawless 08/14/2015, 5:15 PM

## 2015-08-14 NOTE — Transfer of Care (Signed)
Immediate Anesthesia Transfer of Care Note  Patient: Erin Ball  Procedure(s) Performed: Procedure(s): OPEN REDUCTION INTERNAL FIXATION (ORIF) ULNAR FRACTURE AND RADIUS FRACTURE LEFT ARM (Left) CLOSED REDUCTION FINGER WITH PERCUTANEOUS PINNING right thumb (Right)  Patient Location: PACU  Anesthesia Type:General  Level of Consciousness: awake, alert  and oriented  Airway & Oxygen Therapy: Patient Spontanous Breathing and Patient connected to nasal cannula oxygen  Post-op Assessment: Report given to RN and Post -op Vital signs reviewed and stable  Post vital signs: Reviewed and stable  Last Vitals:  Filed Vitals:   08/13/15 2200 08/13/15 2230  BP: 104/69 121/83  Pulse: 71 80  Temp:    Resp:      Complications: No apparent anesthesia complications

## 2015-08-14 NOTE — Progress Notes (Signed)
Occupational Therapy Treatment Patient Details Name: Erin Ball MRN: [id number] DOB: 1971-06-23 Today's Date: 08/14/2015    History of present illness L forearm fx and R thumb fx   OT comments  Pain decreased this afternoon  Follow Up Recommendations  Outpatient OT;Other (comment) (as Md indicates)    Equipment Recommendations  None recommended by OT       Precautions / Restrictions Precautions Precaution Comments: NWB LUE       Mobility Bed Mobility Overal bed mobility: Independent                Transfers Overall transfer level: Independent                        ADL Overall ADL's : Needs assistance/impaired                     Lower Body Dressing: Supervision/safety;Sit to/from stand   Toilet Transfer: Supervision/safety;Ambulation   Toileting- Clothing Manipulation and Hygiene: Supervision/safety;Sit to/from stand;Cueing for sequencing;Cueing for safety       Functional mobility during ADLs: Minimal assistance General ADL Comments: min A with ADL actiivity due to UE surgeries.   Educated on compensation techniques- using R hand , but not  thumb.  Encouraged finger ROM ,as well as elevation                Cognition   Behavior During Therapy: WFL for tasks assessed/performed Overall Cognitive Status: Within Functional Limits for tasks assessed                       Extremity/Trunk Assessment  Upper Extremity Assessment Upper Extremity Assessment: LUE deficits/detail;RUE deficits/detail RUE Deficits / Details: R thumb fracture as well as L forearm fracture                       Pertinent Vitals/ Pain       Pain Assessment: 0-10 Pain Score: 3  Pain Location: L forearm Pain Descriptors / Indicators: Sore Pain Intervention(s): Limited activity within patient's tolerance  Home Living Family/patient expects to be discharged to:: Private residence Living Arrangements: Children;Parent   Type of Home:  House       Home Layout: One level     Bathroom Shower/Tub: Chief Strategy Officer: Standard     Home Equipment: None          Prior Functioning/Environment Level of Independence: Independent            Frequency Min 2X/week     Progress Toward Goals  OT Goals(current goals can now be found in the care plan section)  Progress towards OT goals: Progressing toward goals  Acute Rehab OT Goals Patient Stated Goal: be able to care for my daugther OT Goal Formulation: With patient Time For Goal Achievement: 08/21/15 Potential to Achieve Goals: Good ADL Goals Pt Will Transfer to Toilet: with modified independence;regular height toilet Pt Will Perform Toileting - Clothing Manipulation and hygiene: with modified independence;sit to/from stand Pt/caregiver will Perform Home Exercise Program: Increased ROM;Right Upper extremity;Left upper extremity (L shoulder/ L fingers/ R UE (other than thumb))  Plan            Activity Tolerance Patient tolerated treatment well   Patient Left in bed;with call bell/phone within reach   Nurse Communication Mobility status    Functional Assessment Tool Used: clinical observation Functional Limitation: Self care Self Care Current Status (N0272):  At least 20 percent but less than 40 percent impaired, limited or restricted Self Care Goal Status (W0981(G8988): At least 1 percent but less than 20 percent impaired, limited or restricted   Time: [id number] OT Time Calculation (min): 24 min  Charges: OT G-codes **NOT FOR INPATIENT CLASS** Functional Assessment Tool Used: clinical observation Functional Limitation: Self care Self Care Current Status (F6213(G8987): At least 20 percent but less than 40 percent impaired, limited or restricted Self Care Goal Status (Y8657(G8988): At least 1 percent but less than 20 percent impaired, limited or restricted OT General Charges $OT Visit: 1 Procedure OT Evaluation $Initial OT Evaluation Tier I: 1  Procedure OT Treatments $Self Care/Home Management : 23-37 mins  Amariana Mirando, Metro KungLorraine D 08/14/2015, 1:38 PM

## 2015-08-14 NOTE — Op Note (Signed)
See dictation#[id number] Erin PeaGramig MD
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
## 2015-08-14 NOTE — Evaluation (Signed)
Occupational Therapy Evaluation Patient Details Name: JACKLIN ZWICK MRN: [id number] DOB: 1970/10/29 Today's Date: 08/14/2015    History of Present Illness L forearm fx and R thumb fx   Clinical Impression   Pt admitted with UE fractures. Pt currently with functional limitations due to the deficits listed below (see OT Problem List).  Pt will benefit from skilled OT to increase their safety and independence with ADL and functional mobility for ADL to facilitate discharge to venue listed below.      Follow Up Recommendations  Outpatient OT;Other (comment) (as Md indicates)    Equipment Recommendations  None recommended by OT       Precautions / Restrictions Precautions Precaution Comments: NWB LUE      Mobility Bed Mobility Overal bed mobility: Independent                Transfers Overall transfer level: Independent                         ADL Overall ADL's : Needs assistance/impaired                                       General ADL Comments: Pt overall min A with ADL activity due to UE fractures. OT will teach pt compensation techniques and strategies to increase pt with ADL activity                Pertinent Vitals/Pain Pain Assessment: 0-10 Pain Score: 6  Pain Location: L forearm Pain Descriptors / Indicators: Burning Pain Intervention(s): Monitored during session;Limited activity within patient's tolerance;Repositioned     Hand Dominance     Extremity/Trunk Assessment Upper Extremity Assessment Upper Extremity Assessment: LUE deficits/detail;RUE deficits/detail RUE Deficits / Details: R thumb fracture as well as L forearm fracture           Communication Communication Communication: No difficulties   Cognition Arousal/Alertness: Awake/alert Behavior During Therapy: WFL for tasks assessed/performed Overall Cognitive Status: Within Functional Limits for tasks assessed                                 Home Living Family/patient expects to be discharged to:: Private residence Living Arrangements: Children;Parent   Type of Home: House       Home Layout: One level     Bathroom Shower/Tub: Chief Strategy Officer: Standard     Home Equipment: None          Prior Functioning/Environment Level of Independence: Independent             OT Diagnosis: Generalized weakness;Acute pain   OT Problem List: Decreased strength;Decreased range of motion   OT Treatment/Interventions: Therapeutic exercise    OT Goals(Current goals can be found in the care plan section) Acute Rehab OT Goals Patient Stated Goal: be able to care for my daugther OT Goal Formulation: With patient Time For Goal Achievement: 08/21/15 Potential to Achieve Goals: Good  OT Frequency: Min 2X/week    End of Session Nurse Communication: Mobility status  Activity Tolerance: Patient tolerated treatment well Patient left: in bed;with call bell/phone within reach   Time: 0925-0948 OT Time Calculation (min): 23 min Charges:  OT General Charges $OT Visit: 1 Procedure OT Evaluation $Initial OT Evaluation Tier I: 1 Procedure G-Codes: OT G-codes **NOT FOR INPATIENT CLASS** Functional  Assessment Tool Used: clinical observation Functional Limitation: Self care Self Care Current Status [id number](G8987): At least 20 percent but less than 40 percent impaired, limited or restricted Self Care Goal Status (U0454(G8988): At least 1 percent but less than 20 percent impaired, limited or restricted  Alba CoryREDDING, Dennis Killilea D 08/14/2015, 12:33 PM

## 2015-08-14 NOTE — Anesthesia Postprocedure Evaluation (Signed)
Anesthesia Post Note  Patient: Erin Ball  Procedure(s) Performed: Procedure(s) (LRB): OPEN REDUCTION INTERNAL FIXATION (ORIF) ULNAR FRACTURE AND RADIUS FRACTURE LEFT ARM (Left) CLOSED REDUCTION FINGER WITH PERCUTANEOUS PINNING right thumb (Right)  Patient location during evaluation: PACU Anesthesia Type: General Level of consciousness: awake Pain management: pain level controlled Vital Signs Assessment: post-procedure vital signs reviewed and stable Respiratory status: spontaneous breathing Cardiovascular status: stable Anesthetic complications: no    Last Vitals:  Filed Vitals:   08/14/15 0232 08/14/15 0245  BP: 135/80 111/77  Pulse: 91 78  Temp: 36.9 C   Resp: 18 10    Last Pain:  Filed Vitals:   08/14/15 0246  PainSc: 1                  Orren Pietsch L

## 2015-08-14 NOTE — Discharge Instructions (Signed)

## 2015-08-14 NOTE — Progress Notes (Signed)
PT Cancellation Note  Patient Details Name: Erin Ball MRN: [id number] DOB: May 10, 1971   Cancelled Treatment:    Reason Eval/Treat Not Completed: PT screened, no needs identified, will sign off   Rada HayHill, Merilyn Pagan Elizabeth 08/14/2015, 11:14 AM Blanchard KelchKaren Renner Sebald PT [phone]

## 2015-08-14 NOTE — Anesthesia Procedure Notes (Signed)
Procedure Name: Intubation Date/Time: 08/13/2015 11:46 PM Performed by: Erin LibmanEARDON, Erin Costilla L Patient Re-evaluated:Patient Re-evaluated prior to inductionOxygen Delivery Method: Circle system utilized Preoxygenation: Pre-oxygenation with 100% oxygen Intubation Type: IV induction, Rapid sequence and Cricoid Pressure applied Laryngoscope Size: Miller and 2 Grade View: Grade I Tube type: Oral Tube size: 7.5 mm Number of attempts: 1 Airway Equipment and Method: Stylet Placement Confirmation: ETT inserted through vocal cords under direct vision,  breath sounds checked- equal and bilateral and positive ETCO2 Secured at: 21 cm Tube secured with: Tape Dental Injury: Teeth and Oropharynx as per pre-operative assessment

## 2015-08-28 ENCOUNTER — Encounter (HOSPITAL_COMMUNITY): Payer: Self-pay | Admitting: Emergency Medicine

## 2015-08-28 ENCOUNTER — Emergency Department (HOSPITAL_COMMUNITY)
Admission: EM | Admit: 2015-08-28 | Discharge: 2015-08-28 | Disposition: A | Discharge status: Home / Self Care | Payer: Medicaid Other | Attending: Emergency Medicine | Admitting: Emergency Medicine

## 2015-08-28 DIAGNOSIS — Z793 Long term (current) use of hormonal contraceptives: Secondary | ICD-10-CM | POA: Insufficient documentation

## 2015-08-28 DIAGNOSIS — S301XXA Contusion of abdominal wall, initial encounter: Secondary | ICD-10-CM | POA: Insufficient documentation

## 2015-08-28 DIAGNOSIS — Z792 Long term (current) use of antibiotics: Secondary | ICD-10-CM | POA: Insufficient documentation

## 2015-08-28 DIAGNOSIS — X501XXA Overexertion from prolonged static or awkward postures, initial encounter: Secondary | ICD-10-CM | POA: Insufficient documentation

## 2015-08-28 DIAGNOSIS — Y9289 Other specified places as the place of occurrence of the external cause: Secondary | ICD-10-CM | POA: Insufficient documentation

## 2015-08-28 DIAGNOSIS — Y93F2 Activity, caregiving, lifting: Secondary | ICD-10-CM | POA: Insufficient documentation

## 2015-08-28 DIAGNOSIS — Z862 Personal history of diseases of the blood and blood-forming organs and certain disorders involving the immune mechanism: Secondary | ICD-10-CM | POA: Insufficient documentation

## 2015-08-28 DIAGNOSIS — Z87891 Personal history of nicotine dependence: Secondary | ICD-10-CM | POA: Insufficient documentation

## 2015-08-28 DIAGNOSIS — Y998 Other external cause status: Secondary | ICD-10-CM | POA: Insufficient documentation

## 2015-08-28 DIAGNOSIS — T148XXA Other injury of unspecified body region, initial encounter: Secondary | ICD-10-CM

## 2015-08-28 DIAGNOSIS — S6991XA Unspecified injury of right wrist, hand and finger(s), initial encounter: Secondary | ICD-10-CM | POA: Insufficient documentation

## 2015-08-28 DIAGNOSIS — Z79899 Other long term (current) drug therapy: Secondary | ICD-10-CM | POA: Insufficient documentation

## 2015-08-28 DIAGNOSIS — Z880 Allergy status to penicillin: Secondary | ICD-10-CM | POA: Insufficient documentation
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
## 2016-10-08 ENCOUNTER — Encounter (HOSPITAL_COMMUNITY): Payer: Self-pay

## 2016-10-08 ENCOUNTER — Emergency Department (HOSPITAL_COMMUNITY)
Admission: EM | Admit: 2016-10-08 | Discharge: 2016-10-08 | Disposition: A | Discharge status: Home / Self Care | Payer: Medicaid Other | Attending: Emergency Medicine | Admitting: Emergency Medicine

## 2016-10-08 DIAGNOSIS — Y9389 Activity, other specified: Secondary | ICD-10-CM | POA: Insufficient documentation

## 2016-10-08 DIAGNOSIS — Y999 Unspecified external cause status: Secondary | ICD-10-CM | POA: Insufficient documentation

## 2016-10-08 DIAGNOSIS — Z87891 Personal history of nicotine dependence: Secondary | ICD-10-CM | POA: Insufficient documentation

## 2016-10-08 DIAGNOSIS — W228XXA Striking against or struck by other objects, initial encounter: Secondary | ICD-10-CM | POA: Insufficient documentation

## 2016-10-08 DIAGNOSIS — Y92810 Car as the place of occurrence of the external cause: Secondary | ICD-10-CM | POA: Insufficient documentation

## 2016-10-08 DIAGNOSIS — S0181XA Laceration without foreign body of other part of head, initial encounter: Secondary | ICD-10-CM
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
## 2016-10-08 NOTE — ED Triage Notes (Signed)
Per Pt, Pt hit her head on the side of the door getting in the car. Pt has a 1 cm laceration noted to the middle of the forehead. No bleeding noted.

## 2016-10-08 NOTE — Discharge Instructions (Signed)
Please use bacitracin on the laceration to help prevent infections. Please do not scrub the wound when you take showers for the next few days. You are up-to-date on her tetanus shot. Please watch for signs and symptoms of infection. If any symptoms worsen, please return to the nearest ED.

## 2016-11-09 ENCOUNTER — Ambulatory Visit (HOSPITAL_COMMUNITY)
Admission: EM | Admit: 2016-11-09 | Discharge: 2016-11-09 | Disposition: A | Discharge status: Home / Self Care | Payer: Medicaid Other | Attending: Family Medicine | Admitting: Family Medicine

## 2016-11-09 ENCOUNTER — Encounter (HOSPITAL_COMMUNITY): Payer: Self-pay | Admitting: Family Medicine

## 2016-11-09 DIAGNOSIS — R05 Cough: Secondary | ICD-10-CM

## 2016-11-09 DIAGNOSIS — J00 Acute nasopharyngitis [common cold]: Secondary | ICD-10-CM

## 2016-11-09 DIAGNOSIS — R059 Cough, unspecified: Secondary | ICD-10-CM

## 2016-11-09 MED ORDER — IPRATROPIUM BROMIDE 0.06 % NA SOLN
2.0000 | Freq: Four times a day (QID) | NASAL | 0 refills | Status: DC
Start: 2016-11-09 — End: 2018-08-19

## 2016-11-09 MED ORDER — AZITHROMYCIN 250 MG PO TABS: 250.0000 mg | ORAL_TABLET | Freq: Every day | ORAL | 0 refills | Status: DC

## 2016-11-09 MED ORDER — BENZONATATE 100 MG PO CAPS: 200.0000 mg | ORAL_CAPSULE | Freq: Three times a day (TID) | ORAL | 0 refills | Status: DC | PRN

## 2016-11-09 NOTE — ED Triage Notes (Signed)
Pt here for URI symptoms x a week.
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
## 2018-08-18 ENCOUNTER — Encounter (HOSPITAL_COMMUNITY): Payer: Self-pay

## 2018-08-18 ENCOUNTER — Observation Stay (HOSPITAL_COMMUNITY)
Admission: EM | Admit: 2018-08-18 | Discharge: 2018-08-19 | Disposition: A | Discharge status: Home / Self Care | Payer: Self-pay | Attending: Internal Medicine | Admitting: Internal Medicine

## 2018-08-18 ENCOUNTER — Other Ambulatory Visit: Payer: Self-pay

## 2018-08-18 ENCOUNTER — Emergency Department (HOSPITAL_COMMUNITY): Payer: Self-pay

## 2018-08-18 DIAGNOSIS — N83202 Unspecified ovarian cyst, left side: Principal | ICD-10-CM | POA: Insufficient documentation

## 2018-08-18 DIAGNOSIS — Z87891 Personal history of nicotine dependence: Secondary | ICD-10-CM | POA: Insufficient documentation

## 2018-08-18 DIAGNOSIS — N920 Excessive and frequent menstruation with regular cycle: Secondary | ICD-10-CM | POA: Diagnosis present

## 2018-08-18 DIAGNOSIS — Z8249 Family history of ischemic heart disease and other diseases of the circulatory system: Secondary | ICD-10-CM | POA: Insufficient documentation

## 2018-08-18 DIAGNOSIS — N939 Abnormal uterine and vaginal bleeding, unspecified: Secondary | ICD-10-CM

## 2018-08-18 DIAGNOSIS — D62 Acute posthemorrhagic anemia: Secondary | ICD-10-CM | POA: Insufficient documentation

## 2018-08-18 DIAGNOSIS — Z880 Allergy status to penicillin: Secondary | ICD-10-CM | POA: Insufficient documentation

## 2018-08-18 DIAGNOSIS — B9689 Other specified bacterial agents as the cause of diseases classified elsewhere: Secondary | ICD-10-CM | POA: Insufficient documentation

## 2018-08-18 DIAGNOSIS — Z9889 Other specified postprocedural states: Secondary | ICD-10-CM | POA: Insufficient documentation

## 2018-08-18 DIAGNOSIS — D649 Anemia, unspecified: Secondary | ICD-10-CM | POA: Diagnosis present

## 2018-08-18 DIAGNOSIS — E876 Hypokalemia: Secondary | ICD-10-CM | POA: Insufficient documentation

## 2018-08-18 DIAGNOSIS — N760 Acute vaginitis: Secondary | ICD-10-CM | POA: Insufficient documentation

## 2018-08-18 LAB — CBC WITH DIFFERENTIAL/PLATELET
ABS IMMATURE GRANULOCYTES: 0.02 10*3/uL (ref 0.00–0.07)
BASOS ABS: 0 10*3/uL (ref 0.0–0.1)
Basophils Relative: 1 %
EOS ABS: 0.1 10*3/uL (ref 0.0–0.5)
Eosinophils Relative: 1 %
HEMATOCRIT: 22.7 % — AB (ref 36.0–46.0)
Hemoglobin: 7.2 g/dL — ABNORMAL LOW (ref 12.0–15.0)
IMMATURE GRANULOCYTES: 0 %
LYMPHS PCT: 26 %
Lymphs Abs: 1.3 10*3/uL (ref 0.7–4.0)
MCH: 29.8 pg (ref 26.0–34.0)
MCHC: 31.7 g/dL (ref 30.0–36.0)
MCV: 93.8 fL (ref 80.0–100.0)
Monocytes Absolute: 0.4 10*3/uL (ref 0.1–1.0)
Monocytes Relative: 9 %
NEUTROS ABS: 3.1 10*3/uL (ref 1.7–7.7)
NEUTROS PCT: 63 %
NRBC: 0 % (ref 0.0–0.2)
PLATELETS: 256 10*3/uL (ref 150–400)
RBC: 2.42 MIL/uL — ABNORMAL LOW (ref 3.87–5.11)
RDW: 14.4 % (ref 11.5–15.5)
WBC: 4.9 10*3/uL (ref 4.0–10.5)

## 2018-08-18 LAB — COMPREHENSIVE METABOLIC PANEL
ALT: 18 U/L (ref 0–44)
AST: 31 U/L (ref 15–41)
Albumin: 3.8 g/dL (ref 3.5–5.0)
Alkaline Phosphatase: 40 U/L (ref 38–126)
Anion gap: 8 (ref 5–15)
BILIRUBIN TOTAL: 0.5 mg/dL (ref 0.3–1.2)
BUN: 6 mg/dL (ref 6–20)
CO2: 23 mmol/L (ref 22–32)
CREATININE: 0.88 mg/dL (ref 0.44–1.00)
Calcium: 8.9 mg/dL (ref 8.9–10.3)
Chloride: 105 mmol/L (ref 98–111)
GFR calc Af Amer: >60 mL/min (ref >60)
Glucose, Bld: 97 mg/dL (ref 70–99)
Potassium: 3.4 mmol/L — ABNORMAL LOW (ref 3.5–5.1)
Sodium: 136 mmol/L (ref 135–145)
Total Protein: 6.2 g/dL — ABNORMAL LOW (ref 6.5–8.1)

## 2018-08-18 LAB — CBC
HCT: 23.7 % — ABNORMAL LOW (ref 36.0–46.0)
Hemoglobin: 7.8 g/dL — ABNORMAL LOW (ref 12.0–15.0)
MCH: 31 pg (ref 26.0–34.0)
MCHC: 32.9 g/dL (ref 30.0–36.0)
MCV: 94 fL (ref 80.0–100.0)
PLATELETS: 300 10*3/uL (ref 150–400)
RBC: 2.52 MIL/uL — ABNORMAL LOW (ref 3.87–5.11)
RDW: 14 % (ref 11.5–15.5)
WBC: 5 10*3/uL (ref 4.0–10.5)
nRBC: 0 % (ref 0.0–0.2)

## 2018-08-18 LAB — I-STAT BETA HCG BLOOD, ED (MC, WL, AP ONLY)

## 2018-08-18 LAB — ABO/RH: ABO/RH(D): O POS

## 2018-08-18 LAB — PREPARE RBC (CROSSMATCH)

## 2018-08-18 LAB — WET PREP, GENITAL
Sperm: NONE SEEN
Trich, Wet Prep: NONE SEEN
WBC WET PREP: NONE SEEN
Yeast Wet Prep HPF POC: NONE SEEN

## 2018-08-18 MED ORDER — SODIUM CHLORIDE 0.9 % IV BOLUS
1000.0000 mL | Freq: Once | INTRAVENOUS | Status: AC
Start: 2018-08-18 — End: 2018-08-18
  Administered 2018-08-18: 1000 mL via INTRAVENOUS

## 2018-08-18 MED ORDER — ACETAMINOPHEN 325 MG PO TABS
650.0000 mg | ORAL_TABLET | Freq: Once | ORAL | Status: AC
  Administered 2018-08-18: 650 mg via ORAL
  Filled 2018-08-18: qty 2

## 2018-08-18 MED ORDER — SODIUM CHLORIDE 0.9% IV SOLUTION
Freq: Once | INTRAVENOUS | Status: AC
  Administered 2018-08-18: 22:00:00 via INTRAVENOUS

## 2018-08-18 MED ORDER — MEGESTROL ACETATE 40 MG PO TABS: 40.0000 mg | ORAL_TABLET | Freq: Every day | ORAL | Status: DC

## 2018-08-18 MED ORDER — MEGESTROL ACETATE 40 MG PO TABS
80.0000 mg | ORAL_TABLET | Freq: Every day | ORAL | Status: DC
  Administered 2018-08-18: 80 mg via ORAL
  Filled 2018-08-18 (×2): qty 2

## 2018-08-18 NOTE — Progress Notes (Signed)
Report given to RN on [address]. All questions answered. 
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
## 2018-08-18 NOTE — ED Triage Notes (Signed)
Pt presents for evaluation of heavy vaginal bleeding x 12 days. Pt with hx of vaginal bleeding with need for blood transfusion years ago. Pt reports abd pain and back pain.

## 2018-08-19 ENCOUNTER — Encounter (HOSPITAL_COMMUNITY): Payer: Self-pay | Admitting: Internal Medicine

## 2018-08-19 DIAGNOSIS — D649 Anemia, unspecified: Secondary | ICD-10-CM

## 2018-08-19 DIAGNOSIS — N920 Excessive and frequent menstruation with regular cycle: Secondary | ICD-10-CM

## 2018-08-19 DIAGNOSIS — N939 Abnormal uterine and vaginal bleeding, unspecified: Secondary | ICD-10-CM

## 2018-08-19 LAB — BASIC METABOLIC PANEL
ANION GAP: 4 — AB (ref 5–15)
BUN: 5 mg/dL — ABNORMAL LOW (ref 6–20)
CO2: 24 mmol/L (ref 22–32)
Calcium: 8 mg/dL — ABNORMAL LOW (ref 8.9–10.3)
Chloride: 112 mmol/L — ABNORMAL HIGH (ref 98–111)
Creatinine, Ser: 0.71 mg/dL (ref 0.44–1.00)
Glucose, Bld: 98 mg/dL (ref 70–99)
POTASSIUM: 3.3 mmol/L — AB (ref 3.5–5.1)
SODIUM: 140 mmol/L (ref 135–145)

## 2018-08-19 LAB — TYPE AND SCREEN
ABO/RH(D): O POS
ANTIBODY SCREEN: NEGATIVE
Unit division: 0

## 2018-08-19 LAB — RETICULOCYTES
Immature Retic Fract: 26.3 % — ABNORMAL HIGH (ref 2.3–15.9)
RBC.: 2.76 MIL/uL — AB (ref 3.87–5.11)
RETIC CT PCT: 6 % — AB (ref 0.4–3.1)
Retic Count, Absolute: 158.2 10*3/uL (ref 19.0–186.0)

## 2018-08-19 LAB — CBC
HCT: 25.5 % — ABNORMAL LOW (ref 36.0–46.0)
HEMOGLOBIN: 8.1 g/dL — AB (ref 12.0–15.0)
MCH: 29.3 pg (ref 26.0–34.0)
MCHC: 31.8 g/dL (ref 30.0–36.0)
MCV: 92.4 fL (ref 80.0–100.0)
NRBC: 0 % (ref 0.0–0.2)
Platelets: 258 10*3/uL (ref 150–400)
RBC: 2.76 MIL/uL — AB (ref 3.87–5.11)
RDW: 14.8 % (ref 11.5–15.5)
WBC: 4.4 10*3/uL (ref 4.0–10.5)

## 2018-08-19 LAB — VITAMIN B12: Vitamin B-12: 438 pg/mL (ref 180–914)

## 2018-08-19 LAB — BPAM RBC
BLOOD PRODUCT EXPIRATION DATE: 201912252359
ISSUE DATE / TIME: 201911262119
Unit Type and Rh: 5100

## 2018-08-19 LAB — IRON AND TIBC
Iron: 204 ug/dL — ABNORMAL HIGH (ref 28–170)
SATURATION RATIOS: 67 % — AB (ref 10.4–31.8)
TIBC: 304 ug/dL (ref 250–450)
UIBC: 100 ug/dL

## 2018-08-19 LAB — APTT: APTT: 33 s (ref 24–36)

## 2018-08-19 LAB — PROTIME-INR
INR: 1.15
PROTHROMBIN TIME: 14.6 s (ref 11.4–15.2)

## 2018-08-19 LAB — GC/CHLAMYDIA PROBE AMP (~~LOC~~) NOT AT ARMC
Chlamydia: NEGATIVE
NEISSERIA GONORRHEA: NEGATIVE

## 2018-08-19 LAB — FERRITIN: FERRITIN: 6 ng/mL — AB (ref 11–307)

## 2018-08-19 LAB — HIV ANTIBODY (ROUTINE TESTING W REFLEX): HIV SCREEN 4TH GENERATION: NONREACTIVE

## 2018-08-19 MED ORDER — MEGESTROL ACETATE 40 MG PO TABS
80.0000 mg | ORAL_TABLET | Freq: Two times a day (BID) | ORAL | Status: DC
  Administered 2018-08-19: 80 mg via ORAL
  Filled 2018-08-19: qty 2

## 2018-08-19 MED ORDER — METRONIDAZOLE 500 MG PO TABS: 500.0000 mg | ORAL_TABLET | Freq: Two times a day (BID) | ORAL | 0 refills | Status: AC

## 2018-08-19 MED ORDER — ONDANSETRON HCL 4 MG/2ML IJ SOLN: 4.0000 mg | Freq: Four times a day (QID) | INTRAMUSCULAR | Status: DC | PRN

## 2018-08-19 MED ORDER — POTASSIUM CHLORIDE CRYS ER 20 MEQ PO TBCR
30.0000 meq | EXTENDED_RELEASE_TABLET | Freq: Once | ORAL | Status: AC
  Administered 2018-08-19: 30 meq via ORAL
  Filled 2018-08-19: qty 1

## 2018-08-19 MED ORDER — FERROUS SULFATE 325 (65 FE) MG PO TABS: 325.0000 mg | ORAL_TABLET | Freq: Every day | ORAL | 0 refills | Status: DC

## 2018-08-19 MED ORDER — ONDANSETRON HCL 4 MG PO TABS: 4.0000 mg | ORAL_TABLET | Freq: Four times a day (QID) | ORAL | Status: DC | PRN

## 2018-08-19 MED ORDER — METRONIDAZOLE 500 MG PO TABS
500.0000 mg | ORAL_TABLET | Freq: Two times a day (BID) | ORAL | Status: DC
  Administered 2018-08-19: 500 mg via ORAL
  Filled 2018-08-19: qty 1

## 2018-08-19 MED ORDER — MEGESTROL ACETATE 40 MG PO TABS: 80.0000 mg | ORAL_TABLET | Freq: Two times a day (BID) | ORAL | 0 refills | Status: AC

## 2018-08-19 MED ORDER — POTASSIUM CHLORIDE CRYS ER 20 MEQ PO TBCR
20.0000 meq | EXTENDED_RELEASE_TABLET | Freq: Once | ORAL | Status: AC
  Administered 2018-08-19: 20 meq via ORAL
  Filled 2018-08-19: qty 1

## 2018-08-19 MED ORDER — ACETAMINOPHEN 325 MG PO TABS: 650.0000 mg | ORAL_TABLET | Freq: Four times a day (QID) | ORAL | Status: DC | PRN

## 2018-08-19 MED ORDER — SODIUM CHLORIDE 0.9 % IV SOLN: 510.0000 mg | Freq: Once | INTRAVENOUS | Status: DC

## 2018-08-19 MED ORDER — ACETAMINOPHEN 650 MG RE SUPP: 650.0000 mg | Freq: Four times a day (QID) | RECTAL | Status: DC | PRN
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
## 2018-08-19 NOTE — Discharge Summary (Addendum)
Physician Discharge Summary  Erin Ball JXB:[id number] DOB: 1971-07-21 DOA: 08/18/2018  PCP: Patient, No Pcp Per  Admit date: 08/18/2018 Discharge date: 08/19/2018  Admitted From: Home Disposition:  Home   Recommendations for Outpatient Follow-up and new medication changes:  1. Follow up with GYN as scheduled. 2. Patient has been placed on bid Megace 80 mg 3. Received 1 unit PRBC.  4. Patient will take metronidazole for the next 14 days, for bacterial vaginosis.   Home Health: no   Equipment/Devices: no    Discharge Condition: stable  CODE STATUS: full  Diet recommendation: regular.   Brief/Interim Summary: 47 year old female who presented with vaginal bleeding.  She does have significant past medical history for chronic anemia.  Reported 11 days of continuous and worsening vaginal bleeding, associated with generalized weakness and dyspnea on exertion.  On her initial physical examination blood pressure 103/69, heart rate 73, respiratory rate 18, temperature 98.2, oxygen saturation 95%.  She was pale, lungs clear to auscultation bilaterally, heart S1 S2 present rhythmic, abdomen soft nontender, no lower extremity edema.  Na 136, potassium 3.4, chloride 105, bicarb 23, glucose 97, BUN 6, creatinine 0.88, white count 5.0, hemoglobin 7.8, hematocrit 23.7, platelets 300, ferritin 6, transferrin saturation 67, TIBC 304, iron 204.  INR 1.1. Positive clue wet prep positive.  EKG normal sinus rhythm, normal axis, normal intervals.  Patient was admitted to the hospital with the working diagnosis of symptomatic anemia due to acute blood loss due to vaginal bleeding.  1.  Acute blood loss anemia due to menorrhagia.  Patient was admitted to the medical ward, she received 1 unit packed red blood cells with good toleration, chewing was contacted and patient was started on Megace 80 mg twice daily.  Her discharge hemoglobin is 8.1 with a hematocrit 25.5. Patient will follow up with OBGYN as  outpatient.   2.  Chronic anemia.  Iron stores showed low ferritin, but her transferrin saturation was elevated, normal serum iron and TIBC.  B12 was 438.  Recommend to continue follow-up as an outpatient.  3.  Bacterial vaginosis.  Wet prep positive for clue cells, patient will take metronidazole for 14 days.  4.  Hypokalemia.  Discharge potassium 3.3, patient will receive potassium chloride prior to discharge.  Follow-up as an outpatient.    Discharge Diagnoses:  Principal Problem:   Symptomatic anemia Active Problems:   Menorrhagia   Vaginal bleeding    Discharge Instructions  Discharge Instructions    Diet - low sodium heart healthy   Complete by:  As directed    Discharge instructions   Complete by:  As directed    Please follow with primary care in 7 days and with GYN as scheduled.   Increase activity slowly   Complete by:  As directed      Allergies as of 08/19/2018      Reactions   Penicillins Hives   Has patient had a PCN reaction causing immediate rash, facial/tongue/throat swelling, SOB or lightheadedness with hypotension:Yes Has patient had a PCN reaction causing severe rash involving mucus membranes or skin necrosis: No Has patient had a PCN reaction that required hospitalization:No Has patient had a PCN reaction occurring within the last 10 years:Yes If all of the above answers are "NO", then may proceed with Cephalosporin use.      Medication List    STOP taking these medications   azithromycin 250 MG tablet Commonly known as:  ZITHROMAX   benzonatate 100 MG capsule Commonly known as:  TESSALON   ipratropium 0.06 % nasal spray Commonly known as:  ATROVENT   norgestimate-ethinyl estradiol 0.25-35 MG-MCG tablet Commonly known as:  ORTHO-CYCLEN,SPRINTEC,PREVIFEM     TAKE these medications   megestrol 40 MG tablet Commonly known as:  MEGACE Take 2 tablets (80 mg total) by mouth 2 (two) times daily for 15 days.   metroNIDAZOLE 500 MG  tablet Commonly known as:  FLAGYL Take 1 tablet (500 mg total) by mouth every 12 (twelve) hours for 14 days.       Allergies  Allergen Reactions  . Penicillins Hives    Has patient had a PCN reaction causing immediate rash, facial/tongue/throat swelling, SOB or lightheadedness with hypotension:Yes Has patient had a PCN reaction causing severe rash involving mucus membranes or skin necrosis: No Has patient had a PCN reaction that required hospitalization:No Has patient had a PCN reaction occurring within the last 10 years:Yes If all of the above answers are "NO", then may proceed with Cephalosporin use.     Consultations:  OB GYN over the phone     Procedures/Studies: [place] Transvaginal Non-ob  Result Date: 08/18/2018 CLINICAL DATA:  47 year old female with history of vaginal bleeding for the past 12 days. EXAM: TRANSABDOMINAL AND TRANSVAGINAL ULTRASOUND OF PELVIS TECHNIQUE: Both transabdominal and transvaginal ultrasound examinations of the pelvis were performed. Transabdominal technique was performed for global imaging of the pelvis including uterus, ovaries, adnexal regions, and pelvic cul-de-sac. It was necessary to proceed with endovaginal exam following the transabdominal exam to visualize the endometrium and adnexal regions. COMPARISON:  OB ultrasound 08/01/2014. FINDINGS: Uterus Measurements: 11.2 x 5.6 x 5.4 cm = volume: 176 mL. No fibroids or other mass visualized. Endometrium Thickness: 2.1 cm.  Mildly heterogeneous in echotexture. Right ovary Measurements: 2.1 x 1.3 x 1.8 cm = volume: 8.4 mL. Normal appearance/no adnexal mass. Left ovary Measurements: 4.0 x 3.3 x 2.8 cm = volume: 7.1 mL. 3.3 x 2.8 cm anechoic lesion with increased through transmission, compatible with a simple cyst. Other findings No abnormal free fluid. Technologist reports fluid/blood in the vagina during examination. IMPRESSION: 1. Endometrium is mildly thickened (21 mm) and slightly heterogeneous in  echotexture. No focal lesion identified. If bleeding remains unresponsive to hormonal or medical therapy, focal lesion work-up with sonohysterogram should be considered. Endometrial biopsy should also be considered in pre-menopausal patients at high risk for endometrial carcinoma. (Ref: Radiological Reasoning: Algorithmic Workup of Abnormal Vaginal Bleeding with Endovaginal Sonography and Sonohysterography. AJR 2008; 161:[id number]:[id number]) 2. Small cyst in the left ovary incidentally noted. Electronically Signed   By: Trudie Reedaniel  Entrikin M.D.   On: 08/18/2018 19:10   [place] Pelvis Complete  Result Date: 08/18/2018 CLINICAL DATA:  47 year old female with history of vaginal bleeding for the past 12 days. EXAM: TRANSABDOMINAL AND TRANSVAGINAL ULTRASOUND OF PELVIS TECHNIQUE: Both transabdominal and transvaginal ultrasound examinations of the pelvis were performed. Transabdominal technique was performed for global imaging of the pelvis including uterus, ovaries, adnexal regions, and pelvic cul-de-sac. It was necessary to proceed with endovaginal exam following the transabdominal exam to visualize the endometrium and adnexal regions. COMPARISON:  OB ultrasound 08/01/2014. FINDINGS: Uterus Measurements: 11.2 x 5.6 x 5.4 cm = volume: 176 mL. No fibroids or other mass visualized. Endometrium Thickness: 2.1 cm.  Mildly heterogeneous in echotexture. Right ovary Measurements: 2.1 x 1.3 x 1.8 cm = volume: 8.4 mL. Normal appearance/no adnexal mass. Left ovary Measurements: 4.0 x 3.3 x 2.8 cm = volume: 7.1 mL. 3.3 x 2.8 cm anechoic lesion with increased through transmission, compatible with  a simple cyst. Other findings No abnormal free fluid. Technologist reports fluid/blood in the vagina during examination. IMPRESSION: 1. Endometrium is mildly thickened (21 mm) and slightly heterogeneous in echotexture. No focal lesion identified. If bleeding remains unresponsive to hormonal or medical therapy, focal lesion work-up with sonohysterogram  should be considered. Endometrial biopsy should also be considered in pre-menopausal patients at high risk for endometrial carcinoma. (Ref: Radiological Reasoning: Algorithmic Workup of Abnormal Vaginal Bleeding with Endovaginal Sonography and Sonohysterography. AJR 2008; 161:W96-04) 2. Small cyst in the left ovary incidentally noted. Electronically Signed   By: Trudie Reed M.D.   On: 08/18/2018 19:10       Subjective: Patient feeling better, dyspnea has improved, no chest pain, nausea or vomiting. Vaginal bleeding is improving.   Discharge Exam: Vitals:   08/19/18 0921 08/19/18 1221  BP: 103/67 (!) 139/100  Pulse: 70 69  Resp: 20 20  Temp: 98.8 F (37.1 C) 99.3 F (37.4 C)  SpO2: 100% 100%   Vitals:   08/19/18 0548 08/19/18 0555 08/19/18 0921 08/19/18 1221  BP: (!) 130/110 (!) 101/59 103/67 (!) 139/100  Pulse: 80 77 70 69  Resp: 18  20 20   Temp: 98.1 F (36.7 C)  98.8 F (37.1 C) 99.3 F (37.4 C)  TempSrc: Oral  Oral Oral  SpO2: 100%  100% 100%  Weight:      Height:        General: Not in pain or dyspnea Neurology: Awake and alert, non focal  E ENT: mild pallor, no icterus, oral mucosa moist Cardiovascular: No JVD. S1-S2 present, rhythmic, no gallops, rubs, or murmurs. No lower extremity edema. Pulmonary: vesicular breath sounds bilaterally, adequate air movement, no wheezing, rhonchi or rales. Gastrointestinal. Abdomen flat, no organomegaly, non tender, no rebound or guarding Skin. No rashes Musculoskeletal: no joint deformities   The results of significant diagnostics from this hospitalization (including imaging, microbiology, ancillary and laboratory) are listed below for reference.     Microbiology: Recent Results (from the past 240 hour(s))  Wet prep, genital     Status: Abnormal   Collection Time: 08/18/18  3:51 PM  Result Value Ref Range Status   Yeast Wet Prep HPF POC NONE SEEN NONE SEEN Final   Trich, Wet Prep NONE SEEN NONE SEEN Final   Clue  Cells Wet Prep HPF POC PRESENT (A) NONE SEEN Final   WBC, Wet Prep HPF POC NONE SEEN NONE SEEN Final   Sperm NONE SEEN  Final    Comment: Performed at [facility], [address], [place], [place] [address]     Labs: BNP (last 3 results) No results for input(s): BNP in the last 8760 hours. Basic Metabolic Panel: Recent Labs  Lab 08/18/18 1402 08/19/18 0228  NA 136 140  K 3.4* 3.3*  CL 105 112*  CO2 23 24  GLUCOSE 97 98  BUN 6 5*  CREATININE 0.88 0.71  CALCIUM 8.9 8.0*   Liver Function Tests: Recent Labs  Lab 08/18/18 1402  AST 31  ALT 18  ALKPHOS 40  BILITOT 0.5  PROT 6.2*  ALBUMIN 3.8   No results for input(s): LIPASE, AMYLASE in the last 168 hours. No results for input(s): AMMONIA in the last 168 hours. CBC: Recent Labs  Lab 08/18/18 1402 08/18/18 1822 08/19/18 0228  WBC 5.0 4.9 4.4  NEUTROABS  --  3.1  --   HGB 7.8* 7.2* 8.1*  HCT 23.7* 22.7* 25.5*  MCV 94.0 93.8 92.4  PLT 300 256 258  Cardiac Enzymes: No results for input(s): CKTOTAL, CKMB, CKMBINDEX, TROPONINI in the last 168 hours. BNP: Invalid input(s): POCBNP CBG: No results for input(s): GLUCAP in the last 168 hours. D-Dimer No results for input(s): DDIMER in the last 72 hours. Hgb A1c No results for input(s): HGBA1C in the last 72 hours. Lipid Profile No results for input(s): CHOL, HDL, LDLCALC, TRIG, CHOLHDL, LDLDIRECT in the last 72 hours. Thyroid function studies No results for input(s): TSH, T4TOTAL, T3FREE, THYROIDAB in the last 72 hours.  Invalid input(s): FREET3 Anemia work up Recent Labs    08/19/18 0228  VITAMINB12 438  FERRITIN 6*  TIBC 304  IRON 204*  RETICCTPCT 6.0*   Urinalysis    Component Value Date/Time   COLORURINE YELLOW 08/01/2014 2110   APPEARANCEUR CLEAR 08/01/2014 2110   LABSPEC 1.010 08/01/2014 2110   PHURINE 6.0 08/01/2014 2110   GLUCOSEU NEGATIVE 08/01/2014 2110   HGBUR TRACE (A) 08/01/2014 2110   BILIRUBINUR NEGATIVE 08/01/2014 2110    KETONESUR NEGATIVE 08/01/2014 2110   PROTEINUR NEGATIVE 08/01/2014 2110   UROBILINOGEN 0.2 08/01/2014 2110   NITRITE NEGATIVE 08/01/2014 2110   LEUKOCYTESUR NEGATIVE 08/01/2014 2110   Sepsis Labs Invalid input(s): PROCALCITONIN,  WBC,  LACTICIDVEN Microbiology Recent Results (from the past 240 hour(s))  Wet prep, genital     Status: Abnormal   Collection Time: 08/18/18  3:51 PM  Result Value Ref Range Status   Yeast Wet Prep HPF POC NONE SEEN NONE SEEN Final   Trich, Wet Prep NONE SEEN NONE SEEN Final   Clue Cells Wet Prep HPF POC PRESENT (A) NONE SEEN Final   WBC, Wet Prep HPF POC NONE SEEN NONE SEEN Final   Sperm NONE SEEN  Final    Comment: Performed at [facility], [address] [place], [place] [address]     Time coordinating discharge: 45 minutes  SIGNED:   Coralie Keens, MD  [facility] 08/19/2018, 12:24 PM Pager [phone]  If 7PM-7AM, please contact night-coverage [email or web address] Password [email or web address]

## 2018-08-19 NOTE — Progress Notes (Signed)
Pt has orders to be discharged. Discharge instructions given and pt has no additional questions at this time. Medication regimen reviewed and pt educated. Pt verbalized understanding and has no additional questions. Telemetry box removed. IV removed and site in good condition. Pt stable and waiting for transportation. 

## 2018-08-21 LAB — FOLATE RBC
FOLATE, RBC: 2390 ng/mL (ref >498)
Folate, Hemolysate: 528.3 ng/mL
HEMATOCRIT: 22.1 % — AB (ref 34.0–46.6)

## 2018-08-21 LAB — HEMATOLOGY COMMENTS:

## 2018-09-18 ENCOUNTER — Emergency Department (HOSPITAL_COMMUNITY)
Admission: EM | Admit: 2018-09-18 | Discharge: 2018-09-18 | Disposition: A | Discharge status: Home / Self Care | Payer: Medicaid Other | Attending: Emergency Medicine | Admitting: Emergency Medicine

## 2018-09-18 ENCOUNTER — Other Ambulatory Visit: Payer: Self-pay

## 2018-09-18 ENCOUNTER — Encounter (HOSPITAL_COMMUNITY): Payer: Self-pay

## 2018-09-18 DIAGNOSIS — Z87891 Personal history of nicotine dependence: Secondary | ICD-10-CM | POA: Insufficient documentation

## 2018-09-18 DIAGNOSIS — N946 Dysmenorrhea, unspecified: Secondary | ICD-10-CM

## 2018-09-18 LAB — CBC WITH DIFFERENTIAL/PLATELET
ABS IMMATURE GRANULOCYTES: 0.01 10*3/uL (ref 0.00–0.07)
BASOS PCT: 1 %
Basophils Absolute: 0.1 10*3/uL (ref 0.0–0.1)
EOS ABS: 0.1 10*3/uL (ref 0.0–0.5)
Eosinophils Relative: 1 %
HCT: 33.4 % — ABNORMAL LOW (ref 36.0–46.0)
Hemoglobin: 10.4 g/dL — ABNORMAL LOW (ref 12.0–15.0)
Immature Granulocytes: 0 %
LYMPHS ABS: 1 10*3/uL (ref 0.7–4.0)
Lymphocytes Relative: 20 %
MCH: 28.1 pg (ref 26.0–34.0)
MCHC: 31.1 g/dL (ref 30.0–36.0)
MCV: 90.3 fL (ref 80.0–100.0)
MONO ABS: 0.5 10*3/uL (ref 0.1–1.0)
MONOS PCT: 10 %
Neutro Abs: 3.3 10*3/uL (ref 1.7–7.7)
Neutrophils Relative %: 68 %
PLATELETS: 328 10*3/uL (ref 150–400)
RBC: 3.7 MIL/uL — ABNORMAL LOW (ref 3.87–5.11)
RDW: 12.8 % (ref 11.5–15.5)
WBC: 4.9 10*3/uL (ref 4.0–10.5)
nRBC: 0 % (ref 0.0–0.2)

## 2018-09-18 LAB — COMPREHENSIVE METABOLIC PANEL
ALBUMIN: 3.9 g/dL (ref 3.5–5.0)
ALK PHOS: 41 U/L (ref 38–126)
ALT: 16 U/L (ref 0–44)
AST: 20 U/L (ref 15–41)
Anion gap: 7 (ref 5–15)
BILIRUBIN TOTAL: 0.6 mg/dL (ref 0.3–1.2)
BUN: 5 mg/dL — AB (ref 6–20)
CO2: 26 mmol/L (ref 22–32)
CREATININE: 0.78 mg/dL (ref 0.44–1.00)
Calcium: 9 mg/dL (ref 8.9–10.3)
Chloride: 106 mmol/L (ref 98–111)
GFR calc Af Amer: >60 mL/min (ref >60)
GFR calc non Af Amer: >60 mL/min (ref >60)
GLUCOSE: 98 mg/dL (ref 70–99)
Potassium: 3.7 mmol/L (ref 3.5–5.1)
Sodium: 139 mmol/L (ref 135–145)
TOTAL PROTEIN: 6.5 g/dL (ref 6.5–8.1)

## 2018-09-18 MED ORDER — SODIUM CHLORIDE 0.9 % IV BOLUS
1000.0000 mL | Freq: Once | INTRAVENOUS | Status: AC
  Administered 2018-09-18: 1000 mL via INTRAVENOUS

## 2018-09-18 MED ORDER — MELOXICAM 7.5 MG PO TABS: 7.5000 mg | ORAL_TABLET | Freq: Every day | ORAL | 0 refills | Status: AC

## 2018-09-18 NOTE — ED Notes (Signed)
Patient verbalizes understanding of discharge instructions. Opportunity for questioning and answers were provided. 

## 2018-09-18 NOTE — ED Triage Notes (Signed)
Pt states she has had heavy menstrual cycle X10 days. She states she has been having lower abdominal and back pain yesterday. Hx of heavy bleeding.

## 2018-09-18 NOTE — Discharge Instructions (Addendum)
Your hemoglobin today is 10.4, keep taking your multivitamin.  Follow up with GYN as scheduled as discussed. Return to ER for new or worsening symptoms. Take Meloxicam as needed as prescribed for pain. Do not take Motrin or other NSAIDs while taking Meloxicam.
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
## 2018-09-28 ENCOUNTER — Other Ambulatory Visit (HOSPITAL_COMMUNITY)
Admission: RE | Admit: 2018-09-28 | Discharge: 2018-09-28 | Disposition: A | Discharge status: Home / Self Care | Payer: Self-pay | Source: Ambulatory Visit | Attending: Obstetrics & Gynecology | Admitting: Obstetrics & Gynecology

## 2018-09-28 ENCOUNTER — Ambulatory Visit (INDEPENDENT_AMBULATORY_CARE_PROVIDER_SITE_OTHER): Payer: Self-pay | Admitting: Obstetrics & Gynecology

## 2018-09-28 ENCOUNTER — Encounter: Payer: Self-pay | Admitting: Obstetrics & Gynecology

## 2018-09-28 VITALS — BP 119/76 | HR 86 | Wt 155.0 lb

## 2018-09-28 DIAGNOSIS — N938 Other specified abnormal uterine and vaginal bleeding: Secondary | ICD-10-CM | POA: Insufficient documentation

## 2018-09-28 DIAGNOSIS — R9389 Abnormal findings on diagnostic imaging of other specified body structures: Secondary | ICD-10-CM

## 2018-09-28 DIAGNOSIS — N840 Polyp of corpus uteri: Secondary | ICD-10-CM

## 2018-09-28 LAB — POCT PREGNANCY, URINE: Preg Test, Ur: NEGATIVE

## 2018-09-28 NOTE — Progress Notes (Signed)
   Subjective:    Patient ID: Erin Ball, female    DOB: 1970-10-25, 48 y.o.   MRN: [id number]  HPI 47yo divorced P4 (all daughters) here today for [facility]. She was seen in the MAU for heavy irregular periods. Her hbg was 7 and she received 1 unit of PRBCs. 5 years ago she was also transfused for the same reason. She used megace for 2 weeks which did slow down the bleeding. Her u/s showed a thickened endometrium, 21 mm.   Review of Systems She has been abstinent for 5 years. No recent pap smear She works Systems developer at [employer].    Objective:   Physical Exam Breathing, conversing, and ambulating normally Well nourished, well hydrated White female, no apparent distress  UPT negative, consent signed, time out done Cervix prepped with betadine and grasped with a single tooth tenaculum Uterus sounded to 11 cm Pipelle used for 3 passes with a large amount of tissue obtained. She tolerated the procedure well.     Assessment & Plan:  DUB with thickened endometrium- await EMBX results Come back 1-2 weeks I will send Martie Lee a message to get her set up for pap and mammogram

## 2018-09-28 NOTE — Addendum Note (Signed)
Addended by: Allie Bossier on: 09/28/2018 04:42 PM   Modules accepted: Orders

## 2018-09-29 ENCOUNTER — Encounter: Payer: Self-pay | Admitting: *Deleted

## 2018-10-08 ENCOUNTER — Ambulatory Visit: Payer: Self-pay | Admitting: Obstetrics & Gynecology

## 2018-11-02 ENCOUNTER — Telehealth: Payer: Self-pay | Admitting: *Deleted

## 2018-11-02 NOTE — Telephone Encounter (Signed)
Telephoned patient to schedule a mammo appointment, patient had no voice mail set up.

## 2018-11-12 ENCOUNTER — Telehealth: Payer: Self-pay | Admitting: *Deleted

## 2018-11-12 NOTE — Telephone Encounter (Signed)
Telephoned patient for mammo appointment.  Patient does not have voice mail set up.

## 2018-11-15 ENCOUNTER — Other Ambulatory Visit: Payer: Self-pay

## 2018-11-15 ENCOUNTER — Encounter (HOSPITAL_COMMUNITY): Payer: Self-pay

## 2018-11-15 ENCOUNTER — Emergency Department (HOSPITAL_COMMUNITY)
Admission: EM | Admit: 2018-11-15 | Discharge: 2018-11-15 | Disposition: A | Discharge status: Home / Self Care | Payer: Self-pay | Attending: Emergency Medicine | Admitting: Emergency Medicine

## 2018-11-15 DIAGNOSIS — Z87891 Personal history of nicotine dependence: Secondary | ICD-10-CM | POA: Insufficient documentation

## 2018-11-15 DIAGNOSIS — N938 Other specified abnormal uterine and vaginal bleeding: Secondary | ICD-10-CM | POA: Insufficient documentation

## 2018-11-15 LAB — CBC WITH DIFFERENTIAL/PLATELET
Abs Immature Granulocytes: 0.01 10*3/uL (ref 0.00–0.07)
Basophils Absolute: 0 10*3/uL (ref 0.0–0.1)
Basophils Relative: 1 %
Eosinophils Absolute: 0.1 10*3/uL (ref 0.0–0.5)
Eosinophils Relative: 2 %
HCT: 38.1 % (ref 36.0–46.0)
Hemoglobin: 11.6 g/dL — ABNORMAL LOW (ref 12.0–15.0)
Immature Granulocytes: 0 %
Lymphocytes Relative: 18 %
Lymphs Abs: 1 10*3/uL (ref 0.7–4.0)
MCH: 25.7 pg — ABNORMAL LOW (ref 26.0–34.0)
MCHC: 30.4 g/dL (ref 30.0–36.0)
MCV: 84.5 fL (ref 80.0–100.0)
MONOS PCT: 6 %
Monocytes Absolute: 0.4 10*3/uL (ref 0.1–1.0)
Neutro Abs: 4 10*3/uL (ref 1.7–7.7)
Neutrophils Relative %: 73 %
Platelets: 261 10*3/uL (ref 150–400)
RBC: 4.51 MIL/uL (ref 3.87–5.11)
RDW: 16.7 % — ABNORMAL HIGH (ref 11.5–15.5)
WBC: 5.5 10*3/uL (ref 4.0–10.5)
nRBC: 0 % (ref 0.0–0.2)

## 2018-11-15 LAB — URINALYSIS, ROUTINE W REFLEX MICROSCOPIC
Bilirubin Urine: NEGATIVE
Glucose, UA: NEGATIVE mg/dL
Ketones, ur: NEGATIVE mg/dL
LEUKOCYTE UA: NEGATIVE
Nitrite: NEGATIVE
Protein, ur: NEGATIVE mg/dL
Specific Gravity, Urine: 1.002 — ABNORMAL LOW (ref 1.005–1.030)
pH: 7 (ref 5.0–8.0)

## 2018-11-15 LAB — WET PREP, GENITAL
Clue Cells Wet Prep HPF POC: NONE SEEN
Sperm: NONE SEEN
Trich, Wet Prep: NONE SEEN
Yeast Wet Prep HPF POC: NONE SEEN

## 2018-11-15 LAB — BASIC METABOLIC PANEL
Anion gap: 4 — ABNORMAL LOW (ref 5–15)
BUN: 10 mg/dL (ref 6–20)
CO2: 26 mmol/L (ref 22–32)
Calcium: 8.7 mg/dL — ABNORMAL LOW (ref 8.9–10.3)
Chloride: 108 mmol/L (ref 98–111)
Creatinine, Ser: 0.82 mg/dL (ref 0.44–1.00)
GFR calc Af Amer: >60 mL/min (ref >60)
GFR calc non Af Amer: >60 mL/min (ref >60)
GLUCOSE: 96 mg/dL (ref 70–99)
Potassium: 3.8 mmol/L (ref 3.5–5.1)
Sodium: 138 mmol/L (ref 135–145)

## 2018-11-15 LAB — I-STAT BETA HCG BLOOD, ED (MC, WL, AP ONLY): I-stat hCG, quantitative: <5 m[IU]/mL (ref <5)

## 2018-11-15 MED ORDER — SODIUM CHLORIDE 0.9 % IV BOLUS
1000.0000 mL | Freq: Once | INTRAVENOUS | Status: AC
  Administered 2018-11-15: 1000 mL via INTRAVENOUS

## 2018-11-15 MED ORDER — MEGESTROL ACETATE 40 MG PO TABS: ORAL_TABLET | ORAL | 0 refills | Status: AC
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
## 2018-11-15 NOTE — ED Triage Notes (Signed)
Pt c/o vaginal bleeding for 12 days.. feeling weak and tingling to feet

## 2018-11-15 NOTE — Discharge Instructions (Addendum)
You have been diagnosed today with dysfunctional uterine bleeding.  At this time there does not appear to be the presence of an emergent medical condition, however there is always the potential for conditions to change. Please read and follow the below instructions.  Please return to the Emergency Department immediately for any new or worsening symptoms or if your symptoms do not improve within 3 days. Please be sure to follow up with your Primary Care Provider within one week regarding your visit today; please call their office to schedule an appointment even if you are feeling better for a follow-up visit. Please use the medication the Megace as prescribed to help stop your uterine bleeding. Please follow-up with the women's clinic OB/GYN for further evaluation and treatment of your bleeding.  The women's clinic is now located at [facility] campus off of [facility].  Get help right away if: You develop a fever or chills. You develop chest pain or shortness of breath You need to change your sanitary pad or tampon more than one time per hour. Your vaginal bleeding becomes heavier, or your flow contains clots more often. You develop pain in your abdomen. You lose consciousness. You develop a rash. Any new or concerning symptoms  Please read the additional information packets attached to your discharge summary.  Do not take your medicine if  develop an itchy rash, swelling in your mouth or lips, or difficulty breathing.

## 2018-11-16 LAB — HIV ANTIBODY (ROUTINE TESTING W REFLEX): HIV Screen 4th Generation wRfx: NONREACTIVE

## 2018-11-16 LAB — RPR: RPR: NONREACTIVE

## 2018-11-16 LAB — GC/CHLAMYDIA PROBE AMP (~~LOC~~) NOT AT ARMC
Chlamydia: NEGATIVE
Neisseria Gonorrhea: NEGATIVE

## 2021-09-03 ENCOUNTER — Other Ambulatory Visit: Payer: Self-pay

## 2021-09-03 ENCOUNTER — Ambulatory Visit (HOSPITAL_COMMUNITY)
Admission: EM | Admit: 2021-09-03 | Discharge: 2021-09-03 | Disposition: A | Discharge status: Home / Self Care | Payer: Self-pay | Attending: Family Medicine | Admitting: Family Medicine

## 2021-09-03 ENCOUNTER — Encounter (HOSPITAL_COMMUNITY): Payer: Self-pay

## 2021-09-03 DIAGNOSIS — M25521 Pain in right elbow: Secondary | ICD-10-CM
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
## 2021-09-03 NOTE — ED Triage Notes (Signed)
Pt presented to the office for arm pain after giving plasma x 1-2 days.

## 2021-09-03 NOTE — Discharge Instructions (Addendum)
You have some pain after giving plasma.  I see no signs of infection, and no evidence of vein rupture.  They likely hit a tendon/ligament, which is causing your pain.  I recommend using ice, and using motrin 600mg  three times/day taken with food to help with your pain/discomfort.  Please follow up with your primary care provider if not improving over time.

## 2024-03-03 ENCOUNTER — Other Ambulatory Visit: Payer: Self-pay

## 2024-03-03 ENCOUNTER — Encounter (HOSPITAL_COMMUNITY): Payer: Self-pay | Admitting: *Deleted

## 2024-03-03 ENCOUNTER — Ambulatory Visit (HOSPITAL_COMMUNITY): Admission: EM | Admit: 2024-03-03 | Discharge: 2024-03-03 | Disposition: A | Discharge status: Home / Self Care

## 2024-03-03 DIAGNOSIS — B372 Candidiasis of skin and nail: Secondary | ICD-10-CM

## 2024-03-03 MED ORDER — NYSTATIN 100000 UNIT/GM EX CREA: TOPICAL_CREAM | CUTANEOUS | 0 refills | Status: AC

## 2024-03-03 NOTE — Discharge Instructions (Signed)
 Start applying nystatin cream to this area twice daily. Try to keep this area clean and dry to avoid worsening irritation or spreading. Return here if your symptoms persist or worsen.
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
## 2024-03-03 NOTE — ED Triage Notes (Signed)
 C/O red, splotchy, pruritic rash under left medial breast; states noticed it after her shower this afternoon.

## 2024-08-18 ENCOUNTER — Emergency Department (HOSPITAL_COMMUNITY)
Admission: EM | Admit: 2024-08-18 | Discharge: 2024-08-18 | Disposition: A | Discharge status: Home / Self Care | Attending: Emergency Medicine | Admitting: Emergency Medicine

## 2024-08-18 ENCOUNTER — Other Ambulatory Visit: Payer: Self-pay

## 2024-08-18 ENCOUNTER — Encounter (HOSPITAL_COMMUNITY): Payer: Self-pay

## 2024-08-18 DIAGNOSIS — S060X0A Concussion without loss of consciousness, initial encounter: Secondary | ICD-10-CM | POA: Diagnosis not present

## 2024-08-18 DIAGNOSIS — M542 Cervicalgia: Secondary | ICD-10-CM | POA: Insufficient documentation

## 2024-08-18 DIAGNOSIS — Z87891 Personal history of nicotine dependence: Secondary | ICD-10-CM | POA: Insufficient documentation

## 2024-08-18 DIAGNOSIS — W228XXA Striking against or struck by other objects, initial encounter: Secondary | ICD-10-CM | POA: Diagnosis not present

## 2024-08-18 DIAGNOSIS — S0990XA Unspecified injury of head, initial encounter: Secondary | ICD-10-CM | POA: Diagnosis present

## 2024-08-18 MED ORDER — ONDANSETRON 4 MG PO TBDP
4.0000 mg | ORAL_TABLET | Freq: Once | ORAL | Status: AC
  Administered 2024-08-18: 4 mg via ORAL
  Filled 2024-08-18: qty 1

## 2024-08-18 MED ORDER — IBUPROFEN 400 MG PO TABS
600.0000 mg | ORAL_TABLET | Freq: Once | ORAL | Status: AC
  Administered 2024-08-18: 600 mg via ORAL
  Filled 2024-08-18: qty 1
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
## 2024-08-18 NOTE — Discharge Instructions (Signed)
 We evaluated you for your headache after your head injury.  Your examination is reassuring.  We suspect you may have a mild concussion.  Please try to get some rest and try to avoid activities that can make your concussion worse, like using your phone or watching television for at least the next 48 hours.  Please take Tylenol  (acetaminophen ) and Motrin  (ibuprofen ) for your symptoms at home.  You can take 1000 mg of Tylenol  every 6 hours and 600 mg of Motrin  every 6 hours as needed for your symptoms.  You can take these medicines together as needed, either at the same time, or alternating every 3 hours.  Please follow-up with your primary doctor.  Please return if you have any new or worsening symptoms such as vomiting, worsening headache, loss of vision, trouble walking, or any other new symptoms
